# Patient Record
Sex: Female | Born: 1970 | Race: Black or African American | Hispanic: No | Marital: Single | Smoking: Never smoker
Health system: Southern US, Community
[De-identification: ages and names within clinical notes are randomized; demographics above are authoritative.]

## PROBLEM LIST (undated history)

## (undated) ENCOUNTER — Emergency Department (HOSPITAL_BASED_OUTPATIENT_CLINIC_OR_DEPARTMENT_OTHER): Payer: Self-pay | Source: Home / Self Care

## (undated) DIAGNOSIS — G43909 Migraine, unspecified, not intractable, without status migrainosus: Secondary | ICD-10-CM

## (undated) DIAGNOSIS — I10 Essential (primary) hypertension: Secondary | ICD-10-CM

## (undated) DIAGNOSIS — R87619 Unspecified abnormal cytological findings in specimens from cervix uteri: Secondary | ICD-10-CM

## (undated) DIAGNOSIS — E0500 Thyrotoxicosis with diffuse goiter without thyrotoxic crisis or storm: Secondary | ICD-10-CM

## (undated) HISTORY — DX: Thyrotoxicosis with diffuse goiter without thyrotoxic crisis or storm: E05.00

## (undated) HISTORY — DX: Essential (primary) hypertension: I10

## (undated) HISTORY — DX: Migraine, unspecified, not intractable, without status migrainosus: G43.909

## (undated) HISTORY — DX: Unspecified abnormal cytological findings in specimens from cervix uteri: R87.619

---

## 1998-03-20 ENCOUNTER — Emergency Department (HOSPITAL_COMMUNITY): Admission: EM | Admit: 1998-03-20 | Discharge: 1998-03-20 | Payer: Self-pay | Admitting: Emergency Medicine

## 1998-03-20 ENCOUNTER — Encounter: Payer: Self-pay | Admitting: Emergency Medicine

## 2012-09-24 ENCOUNTER — Ambulatory Visit (INDEPENDENT_AMBULATORY_CARE_PROVIDER_SITE_OTHER): Payer: No Typology Code available for payment source | Admitting: Internal Medicine

## 2012-09-24 ENCOUNTER — Encounter: Payer: Self-pay | Admitting: Internal Medicine

## 2012-09-24 VITALS — BP 150/90 | HR 83 | Temp 98.8°F | Resp 18 | Ht 59.0 in | Wt 165.0 lb

## 2012-09-24 DIAGNOSIS — I10 Essential (primary) hypertension: Secondary | ICD-10-CM

## 2012-09-24 DIAGNOSIS — Z139 Encounter for screening, unspecified: Secondary | ICD-10-CM

## 2012-09-24 DIAGNOSIS — Z8669 Personal history of other diseases of the nervous system and sense organs: Secondary | ICD-10-CM

## 2012-09-24 DIAGNOSIS — E663 Overweight: Secondary | ICD-10-CM | POA: Insufficient documentation

## 2012-09-24 LAB — CBC WITH DIFFERENTIAL/PLATELET
Basophils Absolute: 0 10*3/uL (ref 0.0–0.1)
Basophils Relative: 0 % (ref 0–1)
Eosinophils Absolute: 0.1 10*3/uL (ref 0.0–0.7)
Eosinophils Relative: 1 % (ref 0–5)
HCT: 39.2 % (ref 36.0–46.0)
Hemoglobin: 13.1 g/dL (ref 12.0–15.0)
Lymphocytes Relative: 33 % (ref 12–46)
Lymphs Abs: 2.6 10*3/uL (ref 0.7–4.0)
MCH: 29.9 pg (ref 26.0–34.0)
MCHC: 33.4 g/dL (ref 30.0–36.0)
MCV: 89.5 fL (ref 78.0–100.0)
Monocytes Absolute: 0.4 10*3/uL (ref 0.1–1.0)
Monocytes Relative: 5 % (ref 3–12)
Neutro Abs: 4.8 10*3/uL (ref 1.7–7.7)
Neutrophils Relative %: 61 % (ref 43–77)
Platelets: 421 10*3/uL — ABNORMAL HIGH (ref 150–400)
RBC: 4.38 MIL/uL (ref 3.87–5.11)
RDW: 14.4 % (ref 11.5–15.5)
WBC: 8 10*3/uL (ref 4.0–10.5)

## 2012-09-24 LAB — COMPREHENSIVE METABOLIC PANEL
ALT: 12 U/L (ref 0–35)
AST: 18 U/L (ref 0–37)
Albumin: 4.5 g/dL (ref 3.5–5.2)
Alkaline Phosphatase: 61 U/L (ref 39–117)
BUN: 11 mg/dL (ref 6–23)
CO2: 26 mEq/L (ref 19–32)
Calcium: 9.6 mg/dL (ref 8.4–10.5)
Chloride: 103 mEq/L (ref 96–112)
Creat: 0.91 mg/dL (ref 0.50–1.10)
Glucose, Bld: 76 mg/dL (ref 70–99)
Potassium: 4.3 mEq/L (ref 3.5–5.3)
Sodium: 137 mEq/L (ref 135–145)
Total Bilirubin: 0.5 mg/dL (ref 0.3–1.2)
Total Protein: 7.1 g/dL (ref 6.0–8.3)

## 2012-09-24 LAB — LIPID PANEL
Cholesterol: 161 mg/dL (ref 0–200)
HDL: 52 mg/dL (ref >39)
LDL Cholesterol: 99 mg/dL (ref 0–99)
Total CHOL/HDL Ratio: 3.1 Ratio
Triglycerides: 48 mg/dL (ref <150)
VLDL: 10 mg/dL (ref 0–40)

## 2012-09-24 LAB — TSH: TSH: 1.927 u[IU]/mL (ref 0.350–4.500)

## 2012-09-24 MED ORDER — METOPROLOL SUCCINATE ER 25 MG PO TB24: 25.0000 mg | ORAL_TABLET | Freq: Every day | ORAL | Status: DC

## 2012-09-24 NOTE — Patient Instructions (Addendum)
Activate my chart 

## 2012-09-24 NOTE — Progress Notes (Signed)
  Subjective:    Patient ID: Terri [name], female    DOB: 09/11/1970, 42 y.o.   MRN: [id number]  HPI New pt here for first visit.  PMHOf HTN and migraine headaches.  Shehas been without her Metoprolol for about one year.  Pt reports she did not have insurance for a while  She does have frequent headaches on top of head.  No speech or visual changes.    No Known Allergies Past Medical History  Diagnosis Date  . Hypertension   . Migraine    Past Surgical History  Procedure Laterality Date  . Cesarean section     History   Social History  . Marital Status: Single    Spouse Name: N/A    Number of Children: N/A  . Years of Education: N/A   Occupational History  . Not on file.   Social History Main Topics  . Smoking status: Never Smoker   . Smokeless tobacco: Not on file  . Alcohol Use: Not on file  . Drug Use: No  . Sexually Active: Yes    Birth Control/ Protection: Condom   Other Topics Concern  . Not on file   Social History Narrative  . No narrative on file   Family History  Problem Relation Age of Onset  . Hypertension Mother   . Cancer Maternal Aunt 50    colon Cancer  . Cancer Paternal Aunt 78    brest cancer  . Heart disease Maternal Grandmother   . Stroke Maternal Grandmother   . Stroke Paternal Grandmother    Patient Active Problem List  Diagnosis  . Essential hypertension, benign  . History of migraine headaches  . Overweight   No current outpatient prescriptions on file prior to visit.   No current facility-administered medications on file prior to visit.      Review of Systems See HPI    Objective:   Physical Exam Physical Exam  Nursing note and vitals reviewed.  Constitutional: She is oriented to person, place, and time. She appears well-developed and well-nourished.  HENT:  Head: Normocephalic and atraumatic.  Cardiovascular: Normal rate and regular rhythm. Exam reveals no gallop and no friction rub.  No murmur heard.   Pulmonary/Chest: Breath sounds normal. She has no wheezes. She has no rales.  Neurological: She is alert and oriented to person, place, and time.  Skin: Skin is warm and dry.  Psychiatric: She has a normal mood and affect. Her behavior is normal.  Ext no edema             Assessment & Plan:  HTN  EKG today no acute changes.  Will initiate her former med  Metoprolol 25 mg which she tolerated well  MIgraine headache  Will assess if BP control improved headaches  Overweight  See me in 3 weeks get labs today

## 2012-09-25 ENCOUNTER — Encounter: Payer: Self-pay | Admitting: *Deleted

## 2012-09-25 ENCOUNTER — Telehealth: Payer: Self-pay | Admitting: *Deleted

## 2012-09-25 LAB — VITAMIN D 25 HYDROXY (VIT D DEFICIENCY, FRACTURES): Vit D, 25-Hydroxy: 19 ng/mL — ABNORMAL LOW (ref 30–89)

## 2012-09-25 NOTE — Telephone Encounter (Signed)
Message copied by Mathews Robinsons on Tue Sep 25, 2012  8:52 AM ------      Message from: Raechel Chute D      Created: Tue Sep 25, 2012  8:45 AM       Call pt and advise her to take 2000 units of Vitamin D daily              Ok to mail labs to pt ------

## 2012-09-25 NOTE — Telephone Encounter (Signed)
Let message regarding Vit D also included this advise in lab results that was mailed to home address

## 2012-10-15 ENCOUNTER — Ambulatory Visit (INDEPENDENT_AMBULATORY_CARE_PROVIDER_SITE_OTHER): Payer: No Typology Code available for payment source | Admitting: Internal Medicine

## 2012-10-15 ENCOUNTER — Encounter: Payer: Self-pay | Admitting: Internal Medicine

## 2012-10-15 VITALS — BP 135/82 | HR 80 | Temp 97.1°F | Resp 18 | Wt 165.0 lb

## 2012-10-15 DIAGNOSIS — Z8669 Personal history of other diseases of the nervous system and sense organs: Secondary | ICD-10-CM

## 2012-10-15 DIAGNOSIS — E559 Vitamin D deficiency, unspecified: Secondary | ICD-10-CM

## 2012-10-15 DIAGNOSIS — I10 Essential (primary) hypertension: Secondary | ICD-10-CM

## 2012-10-15 MED ORDER — METOPROLOL SUCCINATE ER 50 MG PO TB24: 50.0000 mg | ORAL_TABLET | Freq: Every day | ORAL | Status: DC

## 2012-10-15 NOTE — Progress Notes (Signed)
  Subjective:    Patient ID: Terri [name], female    DOB: 03-Jan-1971, 42 y.o.   MRN: [id number]  HPI Terri [name] is here for follow up..   She has been taking her Metroprolol and states when she checked her bottle at home she was taking 25 mg bid.    Headaches markedly decreased.     See vitamin D  She is now taking OTC vitamin D 2000 units daily  It has been 4 years since she has had a pap smear  No Known Allergies Past Medical History  Diagnosis Date  . Hypertension   . Migraine    Past Surgical History  Procedure Laterality Date  . Cesarean section     History   Social History  . Marital Status: Single    Spouse Name: N/A    Number of Children: N/A  . Years of Education: N/A   Occupational History  . Not on file.   Social History Main Topics  . Smoking status: Never Smoker   . Smokeless tobacco: Not on file  . Alcohol Use: Not on file  . Drug Use: No  . Sexually Active: Yes    Birth Control/ Protection: Condom   Other Topics Concern  . Not on file   Social History Narrative  . No narrative on file   Family History  Problem Relation Age of Onset  . Hypertension Mother   . Cancer Maternal Aunt 50    colon Cancer  . Cancer Paternal Aunt 43    brest cancer  . Heart disease Maternal Grandmother   . Stroke Maternal Grandmother   . Stroke Paternal Grandmother    Patient Active Problem List  Diagnosis  . Essential hypertension, benign  . History of migraine headaches  . Overweight   Current Outpatient Prescriptions on File Prior to Visit  Medication Sig Dispense Refill  . aspirin-acetaminophen-caffeine (EXCEDRIN MIGRAINE) 250-250-65 MG per tablet Take 1 tablet by mouth every 6 (six) hours as needed for pain.      . Aspirin-Acetaminophen-Caffeine (GOODY HEADACHE PO) Take by mouth.      . metoprolol succinate (TOPROL-XL) 25 MG 24 hr tablet Take 1 tablet (25 mg total) by mouth daily.  90 tablet  3   No current facility-administered medications on file prior  to visit.      Review of Systems    see HPI Objective:   Physical Exam Physical Exam  Nursing note and vitals reviewed.  Constitutional: She is oriented to person, place, and time. She appears well-developed and well-nourished.  HENT:  Head: Normocephalic and atraumatic.  Cardiovascular: Normal rate and regular rhythm. Exam reveals no gallop and no friction rub.  No murmur heard.  Pulmonary/Chest: Breath sounds normal. She has no wheezes. She has no rales.  Neurological: She is alert and oriented to person, place, and time.  Skin: Skin is warm and dry.  Psychiatric: She has a normal mood and affect. Her behavior is normal.             Assessment & Plan:  HTN  Better controlled.  I will place her on her former  dose of Toprol 50 mg ER one daily.  Headaches   Improved with BP control  Vitamin D deficiency  OTC 2000 units  Schedule CPE

## 2012-12-26 ENCOUNTER — Telehealth: Payer: Self-pay | Admitting: *Deleted

## 2012-12-26 NOTE — Telephone Encounter (Signed)
Left VM message regarding appt change

## 2013-01-31 ENCOUNTER — Encounter: Payer: No Typology Code available for payment source | Admitting: Internal Medicine

## 2013-02-04 ENCOUNTER — Encounter (HOSPITAL_BASED_OUTPATIENT_CLINIC_OR_DEPARTMENT_OTHER): Payer: Self-pay | Admitting: *Deleted

## 2013-02-04 ENCOUNTER — Emergency Department (HOSPITAL_BASED_OUTPATIENT_CLINIC_OR_DEPARTMENT_OTHER)
Admission: EM | Admit: 2013-02-04 | Discharge: 2013-02-04 | Disposition: A | Discharge status: Home / Self Care | Payer: No Typology Code available for payment source | Attending: Emergency Medicine | Admitting: Emergency Medicine

## 2013-02-04 DIAGNOSIS — G43909 Migraine, unspecified, not intractable, without status migrainosus: Secondary | ICD-10-CM | POA: Insufficient documentation

## 2013-02-04 DIAGNOSIS — J069 Acute upper respiratory infection, unspecified: Secondary | ICD-10-CM

## 2013-02-04 DIAGNOSIS — R6883 Chills (without fever): Secondary | ICD-10-CM | POA: Insufficient documentation

## 2013-02-04 DIAGNOSIS — J3489 Other specified disorders of nose and nasal sinuses: Secondary | ICD-10-CM | POA: Insufficient documentation

## 2013-02-04 DIAGNOSIS — H579 Unspecified disorder of eye and adnexa: Secondary | ICD-10-CM | POA: Insufficient documentation

## 2013-02-04 DIAGNOSIS — Z79899 Other long term (current) drug therapy: Secondary | ICD-10-CM | POA: Insufficient documentation

## 2013-02-04 DIAGNOSIS — I10 Essential (primary) hypertension: Secondary | ICD-10-CM | POA: Insufficient documentation

## 2013-02-04 DIAGNOSIS — R110 Nausea: Secondary | ICD-10-CM | POA: Insufficient documentation

## 2013-02-04 DIAGNOSIS — B309 Viral conjunctivitis, unspecified: Secondary | ICD-10-CM

## 2013-02-04 DIAGNOSIS — R05 Cough: Secondary | ICD-10-CM | POA: Insufficient documentation

## 2013-02-04 DIAGNOSIS — R059 Cough, unspecified: Secondary | ICD-10-CM | POA: Insufficient documentation

## 2013-02-04 DIAGNOSIS — H5789 Other specified disorders of eye and adnexa: Secondary | ICD-10-CM | POA: Insufficient documentation

## 2013-02-04 DIAGNOSIS — H9209 Otalgia, unspecified ear: Secondary | ICD-10-CM | POA: Insufficient documentation

## 2013-02-04 LAB — RAPID STREP SCREEN (MED CTR MEBANE ONLY): Streptococcus, Group A Screen (Direct): NEGATIVE

## 2013-02-04 MED ORDER — SODIUM CHLORIDE 0.65 % NA SOLN: 1.0000 | NASAL | Status: DC | PRN

## 2013-02-04 MED ORDER — HYDROCODONE-HOMATROPINE 5-1.5 MG/5ML PO SYRP: 5.0000 mL | ORAL_SOLUTION | Freq: Four times a day (QID) | ORAL | Status: DC | PRN

## 2013-02-04 NOTE — ED Provider Notes (Signed)
Medical screening examination/treatment/procedure(s) were performed by non-physician practitioner and as supervising physician I was immediately available for consultation/collaboration.   Carleene Cooper III, MD 02/04/13 [phone]

## 2013-02-04 NOTE — ED Provider Notes (Signed)
History    CSN: [id number] Arrival date & time 02/04/13  1144  First MD Initiated Contact with Patient 02/04/13 1213     Chief Complaint  Patient presents with  . URI   (Consider location/radiation/quality/duration/timing/severity/associated sxs/prior Treatment) HPI  Patient is a 42 year old female past medical history significant for migraines and hypertension presenting to the emergency department for a sore throat, nasal and ear congestion, nonproductive cough, chills, itchy and injected right eye with watery discharge that has been going on since Thursday. Patient has tried Sweet oil in her ears with no relief. No other alleviating factors. No aggravating factors. Denies fever, vomiting, abdominal pain, shortness of breath, chest pain, abdominal pain, diarrhea. Patient states she works in an [employer] with possible sick contacts. Patient does not wear contact lenses.   Past Medical History  Diagnosis Date  . Hypertension   . Migraine    Past Surgical History  Procedure Laterality Date  . Cesarean section     Family History  Problem Relation Age of Onset  . Hypertension Mother   . Cancer Maternal Aunt 50    colon Cancer  . Cancer Paternal Aunt 41    brest cancer  . Heart disease Maternal Grandmother   . Stroke Maternal Grandmother   . Stroke Paternal Grandmother    History  Substance Use Topics  . Smoking status: Never Smoker   . Smokeless tobacco: Not on file  . Alcohol Use: No   OB History   Grav Para Term Preterm Abortions TAB SAB Ect Mult Living   3    2     1      Review of Systems  Constitutional: Positive for chills. Negative for fever.  HENT: Positive for ear pain, congestion, sore throat and rhinorrhea. Negative for facial swelling, neck pain, neck stiffness, voice change and ear discharge.   Eyes: Positive for discharge, redness and itching. Negative for photophobia, pain and visual disturbance.  Respiratory: Positive for cough. Negative for  chest tightness, shortness of breath and wheezing.   Cardiovascular: Negative for chest pain.  Gastrointestinal: Positive for nausea. Negative for vomiting and abdominal pain.  Genitourinary: Negative.   Musculoskeletal: Negative for back pain.  Skin: Negative.   Neurological: Negative for headaches.    Allergies  Review of patient's allergies indicates no known allergies.  Home Medications   Current Outpatient Rx  Name  Route  Sig  Dispense  Refill  . aspirin-acetaminophen-caffeine (EXCEDRIN MIGRAINE) 250-250-65 MG per tablet   Oral   Take 1 tablet by mouth every 6 (six) hours as needed for pain.         . Aspirin-Acetaminophen-Caffeine (GOODY HEADACHE PO)   Oral   Take by mouth.         Marland Kitchen HYDROcodone-homatropine (HYCODAN) 5-1.5 MG/5ML syrup   Oral   Take 5 mLs by mouth every 6 (six) hours as needed for cough.   120 mL   0   . metoprolol succinate (TOPROL-XL) 50 MG 24 hr tablet   Oral   Take 1 tablet (50 mg total) by mouth daily. Take with or immediately following a meal.   90 tablet   3   . sodium chloride (OCEAN) 0.65 % nasal spray   Nasal   Place 1 spray into the nose as needed for congestion.   30 mL   0   . Vitamin D, Ergocalciferol, (DRISDOL) 50000 UNITS CAPS   Oral   Take 50,000 Units by mouth.  BP 170/100  Pulse 95  Temp(Src) 98.7 F (37.1 C) (Oral)  Resp 18  Ht 4\' 11"  (1.499 m)  Wt 164 lb (74.39 kg)  BMI 33.11 kg/m2  SpO2 97%  LMP 02/02/2013 Physical Exam  Constitutional: She is oriented to person, place, and time. She appears well-developed and well-nourished. No distress.  HENT:  Head: Normocephalic and atraumatic. No trismus in the jaw.  Right Ear: External ear normal. No drainage.  Left Ear: Tympanic membrane and external ear normal. No drainage. Tympanic membrane is not injected, not erythematous and not bulging.  No middle ear effusion.  Nose: Rhinorrhea present.  Mouth/Throat: Uvula is midline and mucous membranes are  normal. No edematous. Posterior oropharyngeal erythema present. No oropharyngeal exudate, posterior oropharyngeal edema or tonsillar abscesses.  Sweet oil in right ear unable to visualize TM.   Eyes: EOM are normal. Pupils are equal, round, and reactive to light. Right eye exhibits no exudate and no hordeolum. No foreign body present in the right eye. Left eye exhibits no chemosis, no discharge, no exudate and no hordeolum. No foreign body present in the left eye. Right conjunctiva is injected. Right conjunctiva has no hemorrhage. Left conjunctiva is not injected. Left conjunctiva has no hemorrhage. No scleral icterus.  Watery discharge  Neck: Normal range of motion. Neck supple.  Cardiovascular: Normal rate, regular rhythm, normal heart sounds and intact distal pulses.   Pulmonary/Chest: Effort normal and breath sounds normal. No respiratory distress. She has no wheezes. She exhibits no tenderness.  Abdominal: Soft. There is no tenderness.  Lymphadenopathy:    She has no cervical adenopathy.  Neurological: She is alert and oriented to person, place, and time.  Skin: Skin is warm and dry. She is not diaphoretic.  Psychiatric: She has a normal mood and affect.    ED Course  Procedures (including critical care time) Labs Reviewed  RAPID STREP SCREEN  CULTURE, GROUP A STREP   No results found. 1. URI (upper respiratory infection)   2. Viral conjunctivitis     MDM  Patients symptoms are consistent with URI, likely viral etiology. Patient presentation consistent with viral conjunctivitis.  No purulent discharge, entrapment, consensual photophobia.  Presentation non-concerning for iritis, bacterial conjunctivitis, corneal abrasions, or HSV.  No antibiotics are indicated and patient will be prescribed naphazoline for itching.  Personal hygiene and frequent handwashing discussed.  Patient advised to followup with ophthalmologist if symptoms persist or worsen in any way including vision change or  purulent discharge.  Patient verbalizes understanding and is agreeable with discharge. Discussed that antibiotics are not indicated for viral infections. Pt will be discharged with symptomatic treatment.  Verbalizes understanding and is agreeable with plan. Pt is hemodynamically stable & in NAD prior to dc. Patient is stable at time of discharge    Jeannetta Ellis, PA-C 02/04/13 1346

## 2013-02-04 NOTE — ED Notes (Signed)
Ear ache and feels like ears stopped up sore throat and right eye irritated itchy and watery

## 2013-02-06 ENCOUNTER — Ambulatory Visit (INDEPENDENT_AMBULATORY_CARE_PROVIDER_SITE_OTHER): Payer: No Typology Code available for payment source | Admitting: Internal Medicine

## 2013-02-06 ENCOUNTER — Encounter: Payer: Self-pay | Admitting: Internal Medicine

## 2013-02-06 ENCOUNTER — Ambulatory Visit (HOSPITAL_BASED_OUTPATIENT_CLINIC_OR_DEPARTMENT_OTHER)
Admission: RE | Admit: 2013-02-06 | Discharge: 2013-02-06 | Disposition: A | Discharge status: Home / Self Care | Payer: No Typology Code available for payment source | Source: Ambulatory Visit | Attending: Internal Medicine | Admitting: Internal Medicine

## 2013-02-06 ENCOUNTER — Encounter: Payer: Self-pay | Admitting: *Deleted

## 2013-02-06 VITALS — BP 178/102 | HR 100 | Temp 98.3°F | Resp 20 | Wt 161.0 lb

## 2013-02-06 DIAGNOSIS — Z124 Encounter for screening for malignant neoplasm of cervix: Secondary | ICD-10-CM

## 2013-02-06 DIAGNOSIS — Z1231 Encounter for screening mammogram for malignant neoplasm of breast: Secondary | ICD-10-CM | POA: Insufficient documentation

## 2013-02-06 DIAGNOSIS — H6690 Otitis media, unspecified, unspecified ear: Secondary | ICD-10-CM

## 2013-02-06 DIAGNOSIS — I10 Essential (primary) hypertension: Secondary | ICD-10-CM

## 2013-02-06 DIAGNOSIS — Z1151 Encounter for screening for human papillomavirus (HPV): Secondary | ICD-10-CM

## 2013-02-06 DIAGNOSIS — E559 Vitamin D deficiency, unspecified: Secondary | ICD-10-CM

## 2013-02-06 DIAGNOSIS — Z0000 Encounter for general adult medical examination without abnormal findings: Secondary | ICD-10-CM

## 2013-02-06 DIAGNOSIS — H6692 Otitis media, unspecified, left ear: Secondary | ICD-10-CM

## 2013-02-06 DIAGNOSIS — H109 Unspecified conjunctivitis: Secondary | ICD-10-CM

## 2013-02-06 DIAGNOSIS — H6691 Otitis media, unspecified, right ear: Secondary | ICD-10-CM

## 2013-02-06 LAB — POCT URINALYSIS DIPSTICK
Glucose, UA: NEGATIVE
Ketones, UA: NEGATIVE
Protein, UA: NEGATIVE
Spec Grav, UA: 1.025
Urobilinogen, UA: NEGATIVE

## 2013-02-06 LAB — CULTURE, GROUP A STREP

## 2013-02-06 MED ORDER — CLONIDINE HCL 0.1 MG PO TABS
0.1000 mg | ORAL_TABLET | Freq: Once | ORAL | Status: AC
  Administered 2013-02-06: 0.1 mg via ORAL

## 2013-02-06 MED ORDER — CIPROFLOXACIN HCL 0.3 % OP SOLN: OPHTHALMIC | Status: DC

## 2013-02-06 MED ORDER — CEFTRIAXONE SODIUM 1 G IJ SOLR
1.0000 g | INTRAMUSCULAR | Status: AC
  Administered 2013-02-06: 1 g via INTRAMUSCULAR

## 2013-02-06 MED ORDER — AZITHROMYCIN 250 MG PO TABS: ORAL_TABLET | ORAL | Status: DC

## 2013-02-06 NOTE — Progress Notes (Signed)
Subjective:    Patient ID: Terri [name], female    DOB: Mar 10, 1971, 42 y.o.   MRN: [id number]  HPI Terri [name] is here for CPE and acute visit.    She Had cough, sore throat,  Eye redness with matting  Bilaterally.  Cough has been productive of bloody sputum a few days ago.  Symptoms started 3 days ago.  She also had difficulty hearing with R sided ear pain.   No fever no SOB no chest pain  Exposure to pt with "pink eye".  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  LMP  4 days ago.   See BP  She has not taken her BP med in 2 days as she was nauseated and yesterday vomitied twice.  No emesis today   No Known Allergies Past Medical History  Diagnosis Date  . Hypertension   . Migraine    Past Surgical History  Procedure Laterality Date  . Cesarean section     History   Social History  . Marital Status: Single    Spouse Name: N/A    Number of Children: N/A  . Years of Education: N/A   Occupational History  . Not on file.   Social History Main Topics  . Smoking status: Never Smoker   . Smokeless tobacco: Not on file  . Alcohol Use: No  . Drug Use: No  . Sexually Active: Yes    Birth Control/ Protection: Condom   Other Topics Concern  . Not on file   Social History Narrative  . No narrative on file   Family History  Problem Relation Age of Onset  . Hypertension Mother   . Cancer Maternal Aunt 50    colon Cancer  . Cancer Paternal Aunt 87    brest cancer  . Heart disease Maternal Grandmother   . Stroke Maternal Grandmother   . Stroke Paternal Grandmother    Patient Active Problem List   Diagnosis Date Noted  . Unspecified vitamin D deficiency 10/15/2012  . Essential hypertension, benign 09/24/2012  . History of migraine headaches 09/24/2012  . Overweight 09/24/2012   Current Outpatient Prescriptions on File Prior to Visit  Medication Sig Dispense Refill  . aspirin-acetaminophen-caffeine (EXCEDRIN MIGRAINE)  250-250-65 MG per tablet Take 1 tablet by mouth every 6 (six) hours as needed for pain.      . Aspirin-Acetaminophen-Caffeine (GOODY HEADACHE PO) Take by mouth.      Marland Kitchen HYDROcodone-homatropine (HYCODAN) 5-1.5 MG/5ML syrup Take 5 mLs by mouth every 6 (six) hours as needed for cough.  120 mL  0  . metoprolol succinate (TOPROL-XL) 50 MG 24 hr tablet Take 1 tablet (50 mg total) by mouth daily. Take with or immediately following a meal.  90 tablet  3  . sodium chloride (OCEAN) 0.65 % nasal spray Place 1 spray into the nose as needed for congestion.  30 mL  0  . Vitamin D, Ergocalciferol, (DRISDOL) 50000 UNITS CAPS Take 50,000 Units by mouth.       No current facility-administered medications on file prior to visit.          Review of Systems  HENT: Positive for hearing loss and ear pain. Negative for ear discharge.   Eyes: Positive for discharge, redness and itching. Negative for photophobia, pain and visual disturbance.  Respiratory: Positive for cough.        Objective:   Physical Exam Physical Exam  Vital signs and nursing note reviewed  Constitutional: She is oriented to person, place, and time. She appears well-developed and well-nourished. She is cooperative.  HENT:  Head: Normocephalic and atraumatic.  Right Ear: Tympanic membrane normal.  Left Ear: Tympanic membrane normal.  Nose: Nose normal.  Mouth/Throat: Oropharynx is clear and moist and mucous membranes are normal. No oropharyngeal exudate or posterior oropharyngeal erythema.  Eyes: Conjunctivae and EOM are normal. Pupils are equal, round, and reactive to light.  Neck: Neck supple. No JVD present. Carotid bruit is not present. No mass and no thyromegaly present.  Cardiovascular: Regular rhythm, normal heart sounds, intact distal pulses and normal pulses.  Exam reveals no gallop and no friction rub.   No murmur heard. Pulses:      Dorsalis pedis pulses are 2+ on the right side, and 2+ on the left side.  Pulmonary/Chest:  Breath sounds normal. She has no wheezes. She has no rhonchi. She has no rales. Right breast exhibits no mass, no nipple discharge and no skin change. Left breast exhibits no mass, no nipple discharge and no skin change.  Abdominal: Soft. Bowel sounds are normal. She exhibits no distension and no mass. There is no hepatosplenomegaly. There is no tenderness. There is no CVA tenderness.  Genitourinary: Rectum normal, vagina normal and uterus normal.  No labial fusion. There is no lesion on the right labia. There is no lesion on the left labia. Cervix exhibits no motion tenderness. Right adnexum displays no mass, no tenderness and no fullness. Left adnexum displays no mass, no tenderness and no fullness. No erythema around the vagina.  Musculoskeletal:       No active synovitis to any joint.    Lymphadenopathy:       Right cervical: No superficial cervical adenopathy present.      Left cervical: No superficial cervical adenopathy present.       Right axillary: No pectoral and no lateral adenopathy present.       Left axillary: No pectoral and no lateral adenopathy present.      Right: No inguinal adenopathy present.       Left: No inguinal adenopathy present.  Neurological: She is alert and oriented to person, place, and time. She has normal strength and normal reflexes. No cranial nerve deficit or sensory deficit. She displays a negative Romberg sign. Coordination and gait normal.  Skin: Skin is warm and dry. No abrasion, no bruising, no ecchymosis and no rash noted. No cyanosis. Nails show no clubbing.  Psychiatric: She has a normal mood and affect. Her speech is normal and behavior is normal.          Assessment & Plan:   Health Maintenance:  Will get pap today  Uncontrolled HTN today  Will give clonidine 0.1 mg.  In office.  Pt advised to take her toprol tonight and dialy   Vitamin D deficiency will check today  Conjunctivitis  Ciloxan  1 gtt OU  R OM  Rocephin 1 gm in office today   Will give Z-palk  See me next week              Assessment & Plan:

## 2013-02-06 NOTE — Patient Instructions (Addendum)
See me next week 

## 2013-02-07 ENCOUNTER — Encounter: Payer: Self-pay | Admitting: *Deleted

## 2013-02-07 LAB — VITAMIN D 25 HYDROXY (VIT D DEFICIENCY, FRACTURES): Vit D, 25-Hydroxy: 58 ng/mL (ref 30–89)

## 2013-02-14 ENCOUNTER — Encounter: Payer: Self-pay | Admitting: Internal Medicine

## 2013-02-14 ENCOUNTER — Ambulatory Visit (INDEPENDENT_AMBULATORY_CARE_PROVIDER_SITE_OTHER): Payer: No Typology Code available for payment source | Admitting: Internal Medicine

## 2013-02-14 VITALS — BP 138/88 | HR 83 | Temp 98.6°F | Resp 18 | Wt 165.0 lb

## 2013-02-14 DIAGNOSIS — I10 Essential (primary) hypertension: Secondary | ICD-10-CM

## 2013-02-14 DIAGNOSIS — H109 Unspecified conjunctivitis: Secondary | ICD-10-CM

## 2013-02-14 NOTE — Patient Instructions (Signed)
See me as needed 

## 2013-02-14 NOTE — Progress Notes (Signed)
Subjective:    Patient ID: Terri [name], female    DOB: January 01, 1971, 42 y.o.   MRN: [id number]  HPI Terri [name] is here for follow up  See BP  Improved with daily med use  Conjunctivitis  Improved.   No Known Allergies Past Medical History  Diagnosis Date  . Hypertension   . Migraine    Past Surgical History  Procedure Laterality Date  . Cesarean section     History   Social History  . Marital Status: Single    Spouse Name: N/A    Number of Children: N/A  . Years of Education: N/A   Occupational History  . Not on file.   Social History Main Topics  . Smoking status: Never Smoker   . Smokeless tobacco: Not on file  . Alcohol Use: No  . Drug Use: No  . Sexually Active: Yes    Birth Control/ Protection: Condom   Other Topics Concern  . Not on file   Social History Narrative  . No narrative on file   Family History  Problem Relation Age of Onset  . Hypertension Mother   . Cancer Maternal Aunt 50    colon Cancer  . Cancer Paternal Aunt 79    brest cancer  . Heart disease Maternal Grandmother   . Stroke Maternal Grandmother   . Stroke Paternal Grandmother    Patient Active Problem List   Diagnosis Date Noted  . Unspecified vitamin D deficiency 10/15/2012  . Essential hypertension, benign 09/24/2012  . History of migraine headaches 09/24/2012  . Overweight 09/24/2012   Current Outpatient Prescriptions on File Prior to Visit  Medication Sig Dispense Refill  . aspirin-acetaminophen-caffeine (EXCEDRIN MIGRAINE) 250-250-65 MG per tablet Take 1 tablet by mouth every 6 (six) hours as needed for pain.      . Aspirin-Acetaminophen-Caffeine (GOODY HEADACHE PO) Take by mouth.      . ciprofloxacin (CILOXAN) 0.3 % ophthalmic solution Administer 1 gtt OU qid  5 mL  0  . HYDROcodone-homatropine (HYCODAN) 5-1.5 MG/5ML syrup Take 5 mLs by mouth every 6 (six) hours as needed for cough.  120 mL  0  . Vitamin D, Ergocalciferol, (DRISDOL) 50000 UNITS CAPS Take 50,000 Units by  mouth.      . metoprolol succinate (TOPROL-XL) 50 MG 24 hr tablet Take 1 tablet (50 mg total) by mouth daily. Take with or immediately following a meal.  90 tablet  3  . sodium chloride (OCEAN) 0.65 % nasal spray Place 1 spray into the nose as needed for congestion.  30 mL  0   No current facility-administered medications on file prior to visit.       Review of Systems    See HPI Objective:   Physical Exam Physical Exam  Nursing note and vitals reviewed.  Constitutional: She is oriented to person, place, and time. She appears well-developed and well-nourished.  HENT:  Head: Normocephalic and atraumatic.  Conjuctiva  Clear bilaterally Cardiovascular: Normal rate and regular rhythm. Exam reveals no gallop and no friction rub.  No murmur heard.  Pulmonary/Chest: Breath sounds normal. She has no wheezes. She has no rales.  Neurological: She is alert and oriented to person, place, and time.  Skin: Skin is warm and dry.  Psychiatric: She has a normal mood and affect. Her behavior is normal.              Assessment & Plan:  HTN  Improved  Continue daily toprol  Conjunctivitis  Improved  See me  as needed

## 2013-06-06 ENCOUNTER — Other Ambulatory Visit: Payer: Self-pay

## 2013-10-10 ENCOUNTER — Ambulatory Visit: Payer: No Typology Code available for payment source | Admitting: Internal Medicine

## 2013-11-06 ENCOUNTER — Other Ambulatory Visit: Payer: Self-pay | Admitting: Internal Medicine

## 2013-11-06 NOTE — Telephone Encounter (Signed)
Refill request

## 2013-11-08 ENCOUNTER — Other Ambulatory Visit: Payer: Self-pay | Admitting: *Deleted

## 2013-11-08 MED ORDER — METOPROLOL SUCCINATE ER 50 MG PO TB24: 50.0000 mg | ORAL_TABLET | Freq: Every day | ORAL | Status: DC

## 2013-11-08 NOTE — Telephone Encounter (Signed)
Refill request. Pt will come in for labs within the next week

## 2013-11-11 ENCOUNTER — Other Ambulatory Visit: Payer: Self-pay | Admitting: *Deleted

## 2013-12-04 ENCOUNTER — Telehealth: Payer: Self-pay | Admitting: *Deleted

## 2013-12-04 NOTE — Telephone Encounter (Signed)
See Heather's note 

## 2013-12-04 NOTE — Telephone Encounter (Signed)
Tell Teshia that it [facility] be less [facility] to [facility]  Dr. Marice Potterove or Dr. Wyvonnia LoraBrooke Silva

## 2013-12-04 NOTE — Telephone Encounter (Signed)
Terri [name] [phone] would like a recommendation on who to see to have her tubes tied.

## 2013-12-05 NOTE — Telephone Encounter (Signed)
LVM message regarding recommendation

## 2014-01-06 ENCOUNTER — Other Ambulatory Visit: Payer: Self-pay

## 2014-01-07 LAB — CYTOLOGY - PAP

## 2014-02-05 ENCOUNTER — Encounter: Payer: Self-pay | Admitting: Obstetrics & Gynecology

## 2014-02-05 ENCOUNTER — Ambulatory Visit (INDEPENDENT_AMBULATORY_CARE_PROVIDER_SITE_OTHER): Payer: 59 | Admitting: Obstetrics & Gynecology

## 2014-02-05 VITALS — BP 166/92 | HR 67 | Resp 16 | Ht 59.0 in | Wt 176.0 lb

## 2014-02-05 DIAGNOSIS — Z3009 Encounter for other general counseling and advice on contraception: Secondary | ICD-10-CM

## 2014-02-05 NOTE — Progress Notes (Signed)
   Subjective:    Patient ID: Terri [name], female    DOB: 03/03/1971, 43 y.o.   MRN: [id number]  HPI 43 yo S AA G4P1 A3 20(43 yo daughter), 2 grandchildren. She is here today to discuss permanent sterility.    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    Objective:   Physical Exam        Assessment & Plan:  We had a thorough discussion of all forms of permanent sterilty. She opts for lap BS due to its decrease in future ovarian cancer risk.

## 2014-02-05 NOTE — Patient Instructions (Signed)
Salpingectomy Salpingectomy, also called tubectomy, is the surgical removal of one of the fallopian tubes. The fallopian tubes are tubes that are connected to the uterus. These tubes transport the egg from the ovary to the uterus. A salpingectomy may be done for various reasons, including:   A tubal (ectopic) pregnancy. This is especially true if the tube ruptures.  An infected fallopian tube.  The need to remove the fallopian tube when removing an ovary with a cyst or tumor.  The need to remove the fallopian tube when removing the uterus.  Cancer of the fallopian tube or nearby organs. Removing one fallopian tube does not prevent you from becoming pregnant. It also does not cause problems with your menstrual periods.  LET [facility] CARE PROVIDER KNOW ABOUT:  Any allergies you have.  All medicines you are taking, including vitamins, herbs, eye drops, creams, and over-the-counter medicines.  Previous problems you or members of your family have had with the use of anesthetics.  Any blood disorders you have.  Previous surgeries you have had.  Medical conditions you have. RISKS AND COMPLICATIONS  Generally, this is a safe procedure. However, as with any procedure, complications can occur. Possible complications include:  Injury to surrounding organs.  Bleeding.  Infection.  Problems related to anesthesia. BEFORE THE PROCEDURE  Ask your health care provider about changing or stopping your regular medicines. You may need to stop taking certain medicines, such as aspirin or blood thinners, at least 1 week before the surgery.  Do not eat or drink anything for at least 8 hours before the surgery.  If you smoke, do not smoke for at least 2 weeks before the surgery.  Make plans to have someone drive you home after the procedure or after your hospital stay. Also arrange for someone to help you with activities during recovery. PROCEDURE   You will be given medicine to help you  relax before the procedure (sedative). You will then be given medicine to make you sleep through the procedure (general anesthetic). These medicines will be given through an IV access tube that is put into one of your veins.  Once you are asleep, your lower abdomen will be shaved and cleaned. A thin, flexible tube (catheter) will be placed in your bladder.  The surgeon may use a laparoscopic, robotic, or open technique for this surgery:  In the laparoscopic technique, the surgery is done through two small cuts (incisions) in the abdomen. A thin, lighted tube with a tiny camera on the end (laparoscope) is inserted into one of the incisions. The tools needed for the procedure are put through the other incision.  A robotic technique may be chosen to perform complex surgery in a small space. In the robotic technique, small incisions will be made. A camera and surgical instruments are passed through the incisions. Surgical instruments will be controlled with the help of a robotic arm.  In the open technique, the surgery is done through one large incision in the abdomen.  Using any of these techniques, the surgeon removes the fallopian tube from where it attaches to the uterus. The blood vessels will be clamped and tied.  The surgeon then uses staples or stitches to close the incision or incisions. AFTER THE PROCEDURE   You will be taken to a recovery area where your progress will be monitored for 1-3 hours.  If the laparoscopic technique was used, you may be allowed to go home after several hours. You may have some shoulder pain after  the laparoscopic procedure. This is normal and usually goes away in a day or two.  If the open technique was used, you will be admitted to the hospital for a couple of days.  You will be given pain medicine if needed.  The IV access tube and catheter will be removed before you are discharged. Document Released: 12/04/2008 Document Revised: 05/08/2013 Document  Reviewed: 01/09/2013 [facility] Patient Information 2015 [place], [place]. This information is not intended to replace advice given to you by your health care provider. Make sure you discuss any questions you have with your health care provider. Diagnostic Laparoscopy Laparoscopy is a surgical procedure. It is used to diagnose and treat diseases inside the belly (abdomen). It is usually a brief, common, and relatively simple procedure. The laparoscopeis a thin, lighted, pencil-sized instrument. It is like a telescope. It is inserted into your abdomen through a small cut (incision). Your caregiver can look at the organs inside your body through this instrument. He or she can see if there is anything abnormal. Laparoscopy can be done either in a hospital or outpatient clinic. You may be given a mild sedative to help you relax before the procedure. Once in the operating room, you will be given a drug to make you sleep (general anesthesia). Laparoscopy usually lasts less than 1 hour. After the procedure, you will be monitored in a recovery area until you are stable and doing well. Once you are home, it will take 2 to 3 days to fully recover. RISKS AND COMPLICATIONS  Laparoscopy has relatively few risks. Your caregiver will discuss the risks with you before the procedure. Some problems that can occur include:  Infection.  Bleeding.  Damage to other organs.  Anesthetic side effects. PROCEDURE Once you receive anesthesia, your surgeon inflates the abdomen with a harmless gas (carbon dioxide). This makes the organs easier to see. The laparoscope is inserted into the abdomen through a small incision. This allows your surgeon to see into the abdomen. Other small instruments are also inserted into the abdomen through other small openings. Many surgeons attach a video camera to the laparoscope to enlarge the view. During a diagnostic laparoscopy, the surgeon may be looking for inflammation, infection, or cancer.  Your surgeon may take tissue samples(biopsies). The samples are sent to a specialist in looking at cells and tissue samples (pathologist). The pathologist examines them under a microscope. Biopsies can help to diagnose or confirm a disease. AFTER THE PROCEDURE   The gas is released from inside the abdomen.  The incisions are closed with stitches (sutures). Because these incisions are small (usually less than 1/2 inch), there is usually minimal discomfort after the procedure. There may be some mild discomfort in the throat. This is from the tube placed in the throat while you were sleeping. You may have some mild abdominal discomfort. There may also be discomfort from the instrument placement incisions in the abdomen.  The recovery time is shortened as long as there are no complications.  You will rest in a recovery room until stable and doing well. As long as there are no complications, you may be allowed to go home. FINDING OUT THE RESULTS OF YOUR TEST Not all test results are available during your visit. If your test results are not back during the visit, make an appointment with your caregiver to find out the results. Do not assume everything is normal if you have not heard from your caregiver or the medical facility. It is important for you to  follow up on all of your test results. HOME CARE INSTRUCTIONS   Take all medicines as directed.  Only take over-the-counter or prescription medicines for pain, discomfort, or fever as directed by your caregiver.  Resume daily activities as directed.  Showers are preferred over baths.  You may resume sexual activities in 1 week or as directed.  Do not drive while taking narcotics. SEEK MEDICAL CARE IF:   There is increasing abdominal pain.  There is new pain in the shoulders (shoulder strap areas).  You feel lightheaded or faint.  You have the chills.  You or your child has an oral temperature above 102 F (38.9 C).  There is pus-like  (purulent) drainage from any of the wounds.  You are unable to pass gas or have a bowel movement.  You feel sick to your stomach (nauseous) or throw up (vomit). MAKE SURE YOU:   Understand these instructions.  Will watch your condition.  Will get help right away if you are not doing well or get worse. Document Released: 10/24/2000 Document Revised: 11/12/2012 Document Reviewed: 07/18/2007 [facility] Patient Information 2015 [place], [place]. This information is not intended to replace advice given to you by your health care provider. Make sure you discuss any questions you have with your health care provider.

## 2014-03-17 ENCOUNTER — Encounter (HOSPITAL_COMMUNITY)
Admission: RE | Admit: 2014-03-17 | Discharge: 2014-03-17 | Disposition: A | Discharge status: Home / Self Care | Payer: 59 | Source: Ambulatory Visit | Attending: Obstetrics & Gynecology | Admitting: Obstetrics & Gynecology

## 2014-03-17 ENCOUNTER — Encounter (HOSPITAL_COMMUNITY): Payer: Self-pay

## 2014-03-17 DIAGNOSIS — K660 Peritoneal adhesions (postprocedural) (postinfection): Secondary | ICD-10-CM | POA: Diagnosis not present

## 2014-03-17 DIAGNOSIS — Z7982 Long term (current) use of aspirin: Secondary | ICD-10-CM | POA: Diagnosis not present

## 2014-03-17 DIAGNOSIS — Z302 Encounter for sterilization: Secondary | ICD-10-CM | POA: Diagnosis not present

## 2014-03-17 DIAGNOSIS — G43909 Migraine, unspecified, not intractable, without status migrainosus: Secondary | ICD-10-CM | POA: Diagnosis not present

## 2014-03-17 DIAGNOSIS — I10 Essential (primary) hypertension: Secondary | ICD-10-CM | POA: Diagnosis not present

## 2014-03-17 DIAGNOSIS — Z79899 Other long term (current) drug therapy: Secondary | ICD-10-CM | POA: Diagnosis not present

## 2014-03-17 LAB — CBC
HCT: 36.4 % (ref 36.0–46.0)
Hemoglobin: 11.8 g/dL — ABNORMAL LOW (ref 12.0–15.0)
MCH: 25.9 pg — ABNORMAL LOW (ref 26.0–34.0)
MCHC: 32.4 g/dL (ref 30.0–36.0)
MCV: 80 fL (ref 78.0–100.0)
Platelets: 293 K/uL (ref 150–400)
RBC: 4.55 MIL/uL (ref 3.87–5.11)
RDW: 18.8 % — ABNORMAL HIGH (ref 11.5–15.5)
WBC: 6.5 K/uL (ref 4.0–10.5)

## 2014-03-17 LAB — BASIC METABOLIC PANEL
Anion gap: 9 (ref 5–15)
BUN: 11 mg/dL (ref 6–23)
CALCIUM: 8.9 mg/dL (ref 8.4–10.5)
CO2: 24 mEq/L (ref 19–32)
CREATININE: 0.91 mg/dL (ref 0.50–1.10)
Chloride: 104 mEq/L (ref 96–112)
GFR calc Af Amer: 88 mL/min — ABNORMAL LOW (ref >90)
GFR calc non Af Amer: 76 mL/min — ABNORMAL LOW (ref >90)
GLUCOSE: 90 mg/dL (ref 70–99)
Potassium: 3.7 mEq/L (ref 3.7–5.3)
Sodium: 137 mEq/L (ref 137–147)

## 2014-03-17 NOTE — Patient Instructions (Signed)
Your procedure is scheduled on:03/18/14  Enter through the Main Entrance at :10 am Pick up desk phone and dial [phone] and inform us of your arrival.  Please call [phone] if you have any problems the morning of surgery.  Remember: Do not eat food or drink liquids, including water, after midnight:tonight    You may brush your teeth the morning of surgery.    DO NOT wear jewelry, eye make-up, lipstick,body lotion, or dark fingernail polish.  (Polished toes are ok) You may wear deodorant.  If you are to be admitted after surgery, leave suitcase in car until your room has been assigned. Patients discharged on the day of surgery will not be allowed to drive home. Wear loose fitting, comfortable clothes for your ride home.

## 2014-03-18 ENCOUNTER — Encounter (HOSPITAL_COMMUNITY): Payer: Self-pay

## 2014-03-18 ENCOUNTER — Encounter (HOSPITAL_COMMUNITY)
Admission: RE | Disposition: A | Discharge status: Home / Self Care | Payer: Self-pay | Source: Ambulatory Visit | Attending: Obstetrics & Gynecology

## 2014-03-18 ENCOUNTER — Encounter (HOSPITAL_COMMUNITY): Payer: 59 | Admitting: Certified Registered Nurse Anesthetist

## 2014-03-18 ENCOUNTER — Ambulatory Visit (HOSPITAL_COMMUNITY)
Admission: RE | Admit: 2014-03-18 | Discharge: 2014-03-18 | Disposition: A | Discharge status: Home / Self Care | Payer: 59 | Source: Ambulatory Visit | Attending: Obstetrics & Gynecology | Admitting: Obstetrics & Gynecology

## 2014-03-18 ENCOUNTER — Ambulatory Visit (HOSPITAL_COMMUNITY): Payer: 59 | Admitting: Certified Registered Nurse Anesthetist

## 2014-03-18 DIAGNOSIS — Z7982 Long term (current) use of aspirin: Secondary | ICD-10-CM | POA: Insufficient documentation

## 2014-03-18 DIAGNOSIS — G43909 Migraine, unspecified, not intractable, without status migrainosus: Secondary | ICD-10-CM | POA: Insufficient documentation

## 2014-03-18 DIAGNOSIS — Z302 Encounter for sterilization: Secondary | ICD-10-CM | POA: Diagnosis not present

## 2014-03-18 DIAGNOSIS — I10 Essential (primary) hypertension: Secondary | ICD-10-CM | POA: Insufficient documentation

## 2014-03-18 DIAGNOSIS — Z79899 Other long term (current) drug therapy: Secondary | ICD-10-CM | POA: Insufficient documentation

## 2014-03-18 DIAGNOSIS — K660 Peritoneal adhesions (postprocedural) (postinfection): Secondary | ICD-10-CM | POA: Insufficient documentation

## 2014-03-18 HISTORY — PX: LAPAROSCOPIC BILATERAL SALPINGECTOMY: SHX5889

## 2014-03-18 HISTORY — PX: LAPAROSCOPIC LYSIS OF ADHESIONS: SHX5905

## 2014-03-18 LAB — PREGNANCY, URINE: PREG TEST UR: NEGATIVE

## 2014-03-18 SURGERY — SALPINGECTOMY, BILATERAL, LAPAROSCOPIC
Anesthesia: General | Site: Abdomen | Laterality: Bilateral

## 2014-03-18 MED ORDER — NALOXONE HCL 0.4 MG/ML IJ SOLN
INTRAMUSCULAR | Status: AC
  Administered 2014-03-18: 0.08 mg via INTRAVENOUS
  Filled 2014-03-18: qty 1

## 2014-03-18 MED ORDER — PROPOFOL 10 MG/ML IV BOLUS
INTRAVENOUS | Status: DC | PRN
  Administered 2014-03-18: 150 mg via INTRAVENOUS

## 2014-03-18 MED ORDER — LABETALOL HCL 5 MG/ML IV SOLN
INTRAVENOUS | Status: AC
  Filled 2014-03-18: qty 4

## 2014-03-18 MED ORDER — ACETAMINOPHEN 500 MG PO TABS
ORAL_TABLET | ORAL | Status: AC
  Filled 2014-03-18: qty 2

## 2014-03-18 MED ORDER — FENTANYL CITRATE 0.05 MG/ML IJ SOLN
INTRAMUSCULAR | Status: DC | PRN
  Administered 2014-03-18: 100 ug via INTRAVENOUS
  Administered 2014-03-18: 50 ug via INTRAVENOUS
  Administered 2014-03-18: 100 ug via INTRAVENOUS

## 2014-03-18 MED ORDER — NEOSTIGMINE METHYLSULFATE 10 MG/10ML IV SOLN
INTRAVENOUS | Status: DC | PRN
  Administered 2014-03-18: 3 mg via INTRAVENOUS

## 2014-03-18 MED ORDER — LABETALOL HCL 5 MG/ML IV SOLN
INTRAVENOUS | Status: DC | PRN
  Administered 2014-03-18 (×4): 5 mg via INTRAVENOUS

## 2014-03-18 MED ORDER — BUPIVACAINE HCL (PF) 0.5 % IJ SOLN
INTRAMUSCULAR | Status: DC | PRN
  Administered 2014-03-18: 25 mL

## 2014-03-18 MED ORDER — DEXAMETHASONE SODIUM PHOSPHATE 10 MG/ML IJ SOLN
INTRAMUSCULAR | Status: AC
  Filled 2014-03-18: qty 1

## 2014-03-18 MED ORDER — ONDANSETRON HCL 4 MG/2ML IJ SOLN
INTRAMUSCULAR | Status: AC
  Filled 2014-03-18: qty 2

## 2014-03-18 MED ORDER — HYDROMORPHONE HCL PF 1 MG/ML IJ SOLN
INTRAMUSCULAR | Status: AC
  Filled 2014-03-18: qty 1

## 2014-03-18 MED ORDER — MIDAZOLAM HCL 2 MG/2ML IJ SOLN
INTRAMUSCULAR | Status: DC | PRN
  Administered 2014-03-18: 3 mg via INTRAVENOUS

## 2014-03-18 MED ORDER — NEOSTIGMINE METHYLSULFATE 10 MG/10ML IV SOLN
INTRAVENOUS | Status: AC
  Filled 2014-03-18: qty 1

## 2014-03-18 MED ORDER — HYDROMORPHONE HCL PF 1 MG/ML IJ SOLN
INTRAMUSCULAR | Status: DC | PRN
  Administered 2014-03-18: 1 mg via INTRAVENOUS

## 2014-03-18 MED ORDER — DEXAMETHASONE SODIUM PHOSPHATE 10 MG/ML IJ SOLN
INTRAMUSCULAR | Status: DC | PRN
  Administered 2014-03-18: 4 mg via INTRAVENOUS

## 2014-03-18 MED ORDER — ONDANSETRON HCL 4 MG/2ML IJ SOLN
INTRAMUSCULAR | Status: DC | PRN
  Administered 2014-03-18: 4 mg via INTRAVENOUS

## 2014-03-18 MED ORDER — OXYCODONE-ACETAMINOPHEN 5-325 MG PO TABS: 1.0000 | ORAL_TABLET | Freq: Four times a day (QID) | ORAL | Status: DC | PRN

## 2014-03-18 MED ORDER — SCOPOLAMINE 1 MG/3DAYS TD PT72
MEDICATED_PATCH | TRANSDERMAL | Status: DC
Start: 2014-03-18 — End: 2014-03-18
  Administered 2014-03-18: 1.5 mg via TRANSDERMAL
  Filled 2014-03-18: qty 1

## 2014-03-18 MED ORDER — MIDAZOLAM HCL 2 MG/2ML IJ SOLN
INTRAMUSCULAR | Status: AC
  Filled 2014-03-18: qty 2

## 2014-03-18 MED ORDER — FENTANYL CITRATE 0.05 MG/ML IJ SOLN: 25.0000 ug | INTRAMUSCULAR | Status: DC | PRN

## 2014-03-18 MED ORDER — SCOPOLAMINE 1 MG/3DAYS TD PT72
1.0000 | MEDICATED_PATCH | Freq: Once | TRANSDERMAL | Status: AC
  Administered 2014-03-18: 1 via TRANSDERMAL
  Administered 2014-03-18: 1.5 mg via TRANSDERMAL

## 2014-03-18 MED ORDER — ROCURONIUM BROMIDE 100 MG/10ML IV SOLN
INTRAVENOUS | Status: AC
  Filled 2014-03-18: qty 1

## 2014-03-18 MED ORDER — LIDOCAINE HCL (CARDIAC) 20 MG/ML IV SOLN
INTRAVENOUS | Status: AC
  Filled 2014-03-18: qty 5

## 2014-03-18 MED ORDER — GLYCOPYRROLATE 0.2 MG/ML IJ SOLN
INTRAMUSCULAR | Status: AC
  Filled 2014-03-18: qty 3

## 2014-03-18 MED ORDER — KETOROLAC TROMETHAMINE 30 MG/ML IJ SOLN
INTRAMUSCULAR | Status: AC
  Filled 2014-03-18: qty 1

## 2014-03-18 MED ORDER — FENTANYL CITRATE 0.05 MG/ML IJ SOLN
INTRAMUSCULAR | Status: AC
  Filled 2014-03-18: qty 5

## 2014-03-18 MED ORDER — ROCURONIUM BROMIDE 100 MG/10ML IV SOLN
INTRAVENOUS | Status: DC | PRN
  Administered 2014-03-18: 30 mg via INTRAVENOUS

## 2014-03-18 MED ORDER — BUPIVACAINE HCL (PF) 0.5 % IJ SOLN
INTRAMUSCULAR | Status: AC
  Filled 2014-03-18: qty 30

## 2014-03-18 MED ORDER — MEPERIDINE HCL 25 MG/ML IJ SOLN: 6.2500 mg | INTRAMUSCULAR | Status: DC | PRN

## 2014-03-18 MED ORDER — NALOXONE HCL 0.4 MG/ML IJ SOLN
0.0400 mg | Freq: Once | INTRAMUSCULAR | Status: AC
  Administered 2014-03-18: 0.04 mg via INTRAVENOUS

## 2014-03-18 MED ORDER — NALOXONE HCL 0.4 MG/ML IJ SOLN
0.0800 mg | Freq: Once | INTRAMUSCULAR | Status: AC
  Administered 2014-03-18: 0.08 mg via INTRAVENOUS

## 2014-03-18 MED ORDER — LACTATED RINGERS IV SOLN
INTRAVENOUS | Status: DC
  Administered 2014-03-18 (×2): via INTRAVENOUS

## 2014-03-18 MED ORDER — ACETAMINOPHEN 500 MG PO TABS
1000.0000 mg | ORAL_TABLET | Freq: Once | ORAL | Status: AC
  Administered 2014-03-18: 1000 mg via ORAL

## 2014-03-18 MED ORDER — METOCLOPRAMIDE HCL 5 MG/ML IJ SOLN: 10.0000 mg | Freq: Once | INTRAMUSCULAR | Status: DC | PRN

## 2014-03-18 MED ORDER — GLYCOPYRROLATE 0.2 MG/ML IJ SOLN
INTRAMUSCULAR | Status: DC | PRN
  Administered 2014-03-18: .4 mg via INTRAVENOUS

## 2014-03-18 MED ORDER — IBUPROFEN 600 MG PO TABS: 600.0000 mg | ORAL_TABLET | Freq: Four times a day (QID) | ORAL | Status: DC | PRN

## 2014-03-18 MED ORDER — PROPOFOL 10 MG/ML IV EMUL
INTRAVENOUS | Status: AC
  Filled 2014-03-18: qty 20

## 2014-03-18 SURGICAL SUPPLY — 31 items
APPLICATOR COTTON TIP 6IN STRL (MISCELLANEOUS) ×3 IMPLANT
BLADE SURG 11 STRL SS (BLADE) ×3 IMPLANT
CABLE HIGH FREQUENCY MONO STRZ (ELECTRODE) IMPLANT
CATH ROBINSON RED A/P 16FR (CATHETERS) ×3 IMPLANT
CHLORAPREP W/TINT 26ML (MISCELLANEOUS) ×3 IMPLANT
CLOTH BEACON ORANGE TIMEOUT ST (SAFETY) ×3 IMPLANT
DRSG COVADERM PLUS 2X2 (GAUZE/BANDAGES/DRESSINGS) ×6 IMPLANT
DRSG OPSITE POSTOP 3X4 (GAUZE/BANDAGES/DRESSINGS) IMPLANT
ELECT REM PT RETURN 9FT ADLT (ELECTROSURGICAL)
ELECTRODE REM PT RTRN 9FT ADLT (ELECTROSURGICAL) IMPLANT
FORCEPS CUTTING 33CM 5MM (CUTTING FORCEPS) IMPLANT
GLOVE BIO SURGEON STRL SZ 6.5 (GLOVE) ×3 IMPLANT
GOWN STRL REUS W/TWL LRG LVL3 (GOWN DISPOSABLE) ×6 IMPLANT
NDL SAFETY ECLIPSE 18X1.5 (NEEDLE) ×2 IMPLANT
NEEDLE HYPO 18GX1.5 SHARP (NEEDLE) ×1
NEEDLE INSUFFLATION 120MM (ENDOMECHANICALS) ×3 IMPLANT
NS IRRIG 1000ML POUR BTL (IV SOLUTION) ×3 IMPLANT
PACK LAPAROSCOPY BASIN (CUSTOM PROCEDURE TRAY) ×3 IMPLANT
POUCH SPECIMEN RETRIEVAL 10MM (ENDOMECHANICALS) IMPLANT
PROTECTOR NERVE ULNAR (MISCELLANEOUS) ×3 IMPLANT
SET IRRIG TUBING LAPAROSCOPIC (IRRIGATION / IRRIGATOR) IMPLANT
SHEARS HARMONIC ACE PLUS 36CM (ENDOMECHANICALS) ×3 IMPLANT
STRIP CLOSURE SKIN 1/2X4 (GAUZE/BANDAGES/DRESSINGS) ×3 IMPLANT
SUT VICRYL 0 UR6 27IN ABS (SUTURE) ×3 IMPLANT
SUT VICRYL 4-0 PS2 18IN ABS (SUTURE) ×6 IMPLANT
TOWEL OR 17X24 6PK STRL BLUE (TOWEL DISPOSABLE) ×6 IMPLANT
TROCAR OPTI TIP 5M 100M (ENDOMECHANICALS) ×6 IMPLANT
TROCAR XCEL DIL TIP R 11M (ENDOMECHANICALS) ×3 IMPLANT
TROCAR XCEL NON-BLD 5MMX100MML (ENDOMECHANICALS) IMPLANT
WARMER LAPAROSCOPE (MISCELLANEOUS) ×3 IMPLANT
WATER STERILE IRR 1000ML POUR (IV SOLUTION) ×3 IMPLANT

## 2014-03-18 NOTE — Transfer of Care (Signed)
Immediate Anesthesia Transfer of Care Note  Patient: Terri [name]  Procedure(s) Performed: Procedure(s): LAPAROSCOPIC BILATERAL SALPINGECTOMY (Bilateral) LAPAROSCOPIC LYSIS OF ADHESIONS (Bilateral)  Patient Location: PACU  Anesthesia Type:General  Level of Consciousness: awake, oriented and sedated  Airway & Oxygen Therapy: Patient Spontanous Breathing and Patient connected to nasal cannula oxygen  Post-op Assessment: Report given to PACU RN, Post -op Vital signs reviewed and stable and Patient moving all extremities X 4  Post vital signs: Reviewed and stable  Complications: No apparent anesthesia complications

## 2014-03-18 NOTE — Anesthesia Postprocedure Evaluation (Signed)
  Anesthesia Post-op Note  Patient: Terri [name]  Procedure(s) Performed: Procedure(s): LAPAROSCOPIC BILATERAL SALPINGECTOMY (Bilateral) LAPAROSCOPIC LYSIS OF ADHESIONS (Bilateral) Patient is awake and responsive. Pain and nausea are reasonably well controlled. Vital signs are stable and clinically acceptable. Oxygen saturation is clinically acceptable. There are no apparent anesthetic complications at this time. Patient is ready for discharge.

## 2014-03-18 NOTE — Op Note (Signed)
03/18/2014  12:17 PM  PATIENT:  Terri [name]  43 y.o. female  PRE-OPERATIVE DIAGNOSIS:  Undesired fertility  POST-OPERATIVE DIAGNOSIS:  Undesired fertility  PROCEDURE:  Procedure(s): LAPAROSCOPIC BILATERAL SALPINGECTOMY (Bilateral) LAPAROSCOPIC LYSIS OF ADHESIONS (Bilateral)  SURGEON:  Surgeon(s) and Role:    * Allie BossierMyra C Christofer Shen, MD - Primary  PHYSICIAN ASSISTANT:   ASSISTANTS: none   ANESTHESIA:   general  EBL:  Total I/O In: 1000 [I.V.:1000] Out: -   BLOOD ADMINISTERED:none  DRAINS: none   LOCAL MEDICATIONS USED:  MARCAINE     SPECIMEN:  Source of Specimen:  both oviducts  DISPOSITION OF SPECIMEN:  PATHOLOGY  COUNTS:  YES  TOURNIQUET:  * No tourniquets in log *  DICTATION: .Dragon Dictation  PLAN OF CARE: Discharge to home after PACU  PATIENT DISPOSITION:  PACU - hemodynamically stable.   Delay start of Pharmacological VTE agent (>24hrs) due to surgical blood loss or risk of bleeding: n/a  The risks, benefits, and alternatives of surgery were explained, understood, accepted. She is certain that she wants permanent sterility. She understands that this is not reversible. She understands there is a small failure rate of this procedure. She also would like to have both oviducts removed her due newest recommendations to help prevent ovarian/peritoneal cancer. In the operating room she was placed in the dorsal lithotomy position, and general anesthesia was given without complication. Her abdomen and vagina were prepped and draped in the usual sterile fashion. A timeout procedure was done. A bimanual exam revealed a normal size and shaped anteverted and mobile uterus. Her adnexa felt normal. A Hulka manipulator was placed. Her bladder was emptied with a Robinson catheter. Gloves were changed, and attention was turned to the abdomen. Approximately the 5 mL of 0.5% Marcaine was injected into the umbilicus. A vertical incision was made at the site. A varies needle was placed  intraperitoneally. Low-flow CO2 was used to insufflate the abdomen to approximately 3-1/2 L. Once a good pneumoperitoneum was established, a 10 mm Excel trocar was placed. Laparoscopy confirmed correct placement. A 5 mm port was placed in each lower quadrant under direct laparoscopic visualization after injecting 0.5% Marcaine in the incision sites. Her pelvis and upper abdomen appeared normal with the exception of moderately dense adhesions tethering the right adnexa deep into the pelvis. I used the Harmonic scapel to release these adhesions.  A Harmonic scapel was used to hemostatically removed both oviducts. I switched to a 5 mm camera and removed the oviducts through the umbilical port.  I removed the 5 mm ports and noted hemostasis. The umbilical fascia was closed with a 0 vicryl suture. No defects were palpable. A subcuticular closure was done with 4-0 Vicryl suture at all incision sites. A Steri-Strip was placed across each incision. She was extubated and taken to the recovery room in stable condition.

## 2014-03-18 NOTE — Anesthesia Preprocedure Evaluation (Signed)
Anesthesia Evaluation  Patient identified by MRN, date of birth, ID band Patient awake    Reviewed: Allergy & Precautions, H&P , NPO status , Patient's Chart, lab work & pertinent test results  Airway Mallampati: II TM Distance: >3 FB Neck ROM: Full    Dental no notable dental hx. (+) Teeth Intact   Pulmonary neg pulmonary ROS,    Pulmonary exam normal       Cardiovascular hypertension, negative cardio ROS  Rhythm:Regular Rate:Normal     Neuro/Psych  Headaches, negative psych ROS   GI/Hepatic negative GI ROS, Neg liver ROS,   Endo/Other  Obesity  Renal/GU negative Renal ROS  negative genitourinary   Musculoskeletal negative musculoskeletal ROS (+)   Abdominal (+) + obese,   Peds  Hematology negative hematology ROS (+)   Anesthesia Other Findings   Reproductive/Obstetrics Undesired fertility                           Anesthesia Physical Anesthesia Plan  ASA: II  Anesthesia Plan: General   Post-op Pain Management:    Induction:   Airway Management Planned: Natural Airway  Additional Equipment:   Intra-op Plan:   Post-operative Plan: Extubation in OR  Informed Consent: I have reviewed the patients History and Physical, chart, labs and discussed the procedure including the risks, benefits and alternatives for the proposed anesthesia with the patient or authorized representative who has indicated his/her understanding and acceptance.   Dental advisory given  Plan Discussed with: CRNA, Anesthesiologist and Surgeon  Anesthesia Plan Comments:         Anesthesia Quick Evaluation

## 2014-03-18 NOTE — H&P (Signed)
Terri [name] is an 43 y.o. S AA G4P1 43(43 yo daughter) female. She is here today to have laparoscopy and removal of both of her oviducts removed for permanent sterility and to get the benefit of decreased risk of ovarian cancer in the future.  Pertinent Gynecological History: Menses: flow is moderate Bleeding: normal for 4-5 days per month Contraception: condoms DES exposure: denies Blood transfusions: none Sexually transmitted diseases: no past history Previous GYN Procedures: none  Last mammogram: normal Date: 2014 Last pap: normal Date: 2015    Menstrual History: Menarche age: [age] Patient's last menstrual period was 03/18/2014.    Past Medical History  Diagnosis Date  . Hypertension   . Migraine   . Abnormal Pap smear of cervix     cryotherapy    Past Surgical History  Procedure Laterality Date  . No past surgeries      Family History  Problem Relation Age of Onset  . Hypertension Mother   . Cancer Maternal Aunt 50    colon Cancer  . Cancer Paternal Aunt 850    brest cancer  . Heart disease Maternal Grandmother   . Stroke Maternal Grandmother   . Stroke Paternal Grandmother     Social History:  reports that she has never smoked. She has never used smokeless tobacco. She reports that she does not drink alcohol or use illicit drugs.  Allergies: No Known Allergies  Prescriptions prior to admission  Medication Sig Dispense Refill  . aspirin-acetaminophen-caffeine (EXCEDRIN MIGRAINE) 250-250-65 MG per tablet Take 1 tablet by mouth every 6 (six) hours as needed for pain.      . Aspirin-Acetaminophen-Caffeine (GOODY HEADACHE PO) Take by mouth.      . metoprolol succinate (TOPROL-XL) 50 MG 24 hr tablet Take 1 tablet (50 mg total) by mouth daily. Take with or immediately following a meal.  90 tablet  3  . sodium chloride (OCEAN) 0.65 % nasal spray Place 1 spray into the nose as needed for congestion.  30 mL  0  . Vitamin D, Ergocalciferol, (DRISDOL) 50000 UNITS CAPS  Take 50,000 Units by mouth.        [employer]  Blood pressure 156/91, pulse 70, temperature 98.1 F (36.7 C), temperature source Oral, resp. rate 18, last menstrual period 03/18/2014, SpO2 97.00%. Physical Exam Heart-rrr Lungs- CTAB Abd- benign  No results found for this or any previous visit (from the past 24 hour(s)).  No results found.  Assessment/Plan: Multiparity, desires sterility. I will plan to do a laparoscopic bilateral salpingectomy.  She understands the risks of surgery, including, but not to infection, bleeding, DVTs, damage to bowel, bladder, ureters. She wishes to proceed.     Ulys Favia C. 03/18/2014, 10:46 AM

## 2014-03-18 NOTE — Discharge Instructions (Signed)
Diagnostic Laparoscopy Laparoscopy is a surgical procedure. It is used to diagnose and treat diseases inside the belly (abdomen). It is usually a brief, common, and relatively simple procedure. The laparoscopeis a thin, lighted, pencil-sized instrument. It is like a telescope. It is inserted into your abdomen through a small cut (incision). Your caregiver can look at the organs inside your body through this instrument. He or she can see if there is anything abnormal. Laparoscopy can be done either in a hospital or outpatient clinic. You may be given a mild sedative to help you relax before the procedure. Once in the operating room, you will be given a drug to make you sleep (general anesthesia). Laparoscopy usually lasts less than 1 hour. After the procedure, you will be monitored in a recovery area until you are stable and doing well. Once you are home, it will take 2 to 3 days to fully recover. RISKS AND COMPLICATIONS  Laparoscopy has relatively few risks. Your caregiver will discuss the risks with you before the procedure. Some problems that can occur include:  Infection.  Bleeding.  Damage to other organs.  Anesthetic side effects. PROCEDURE Once you receive anesthesia, your surgeon inflates the abdomen with a harmless gas (carbon dioxide). This makes the organs easier to see. The laparoscope is inserted into the abdomen through a small incision. This allows your surgeon to see into the abdomen. Other small instruments are also inserted into the abdomen through other small openings. Many surgeons attach a video camera to the laparoscope to enlarge the view. During a diagnostic laparoscopy, the surgeon may be looking for inflammation, infection, or cancer. Your surgeon may take tissue samples(biopsies). The samples are sent to a specialist in looking at cells and tissue samples (pathologist). The pathologist examines them under a microscope. Biopsies can help to diagnose or confirm a  disease. AFTER THE PROCEDURE   The gas is released from inside the abdomen.  The incisions are closed with stitches (sutures). Because these incisions are small (usually less than 1/2 inch), there is usually minimal discomfort after the procedure. There may be some mild discomfort in the throat. This is from the tube placed in the throat while you were sleeping. You may have some mild abdominal discomfort. There may also be discomfort from the instrument placement incisions in the abdomen.  The recovery time is shortened as long as there are no complications.  You will rest in a recovery room until stable and doing well. As long as there are no complications, you may be allowed to go home. FINDING OUT THE RESULTS OF YOUR TEST Not all test results are available during your visit. If your test results are not back during the visit, make an appointment with your caregiver to find out the results. Do not assume everything is normal if you have not heard from your caregiver or the medical facility. It is important for you to follow up on all of your test results. HOME CARE INSTRUCTIONS   Take all medicines as directed.  Only take over-the-counter or prescription medicines for pain, discomfort, or fever as directed by your caregiver.  Resume daily activities as directed.  Showers are preferred over baths.  You may resume sexual activities in 1 week or as directed.  Do not drive while taking narcotics. SEEK MEDICAL CARE IF:   There is increasing abdominal pain.  There is new pain in the shoulders (shoulder strap areas).  You feel lightheaded or faint.  You have the chills.  You or  your child has an oral temperature above 102 F (38.9 C).  There is pus-like (purulent) drainage from any of the wounds.  You are unable to pass gas or have a bowel movement.  You feel sick to your stomach (nauseous) or throw up (vomit). MAKE SURE YOU:   Understand these instructions.  Will watch  your condition.  Will get help right away if you are not doing well or get worse. Document Released: 10/24/2000 Document Revised: 11/12/2012 Document Reviewed: 07/18/2007 [facility] Patient Information 2015 [employer] [place]. This information is not intended to replace advice given to you by your health care provider. Make sure you discuss any questions you have with your health care provider.   NO SEX for 2 WEEKS.   DISCHARGE INSTRUCTIONS: Laparoscopy  The following instructions have been prepared to help you care for yourself upon your return home today.  Wound care:  Do not get the incision wet for the first 24 hours. The incision should be kept clean and dry.  The Band-Aids or dressings may be removed the day after surgery.  Should the incision become sore, red, and swollen after the first week, check with your doctor.  Personal hygiene:  Shower the day after your procedure.  Activity and limitations:  Do NOT drive or operate any equipment today.  Do NOT lift anything more than 15 pounds for 2-3 weeks after surgery.  Do NOT rest in bed all day.  Walking is encouraged. Walk each day, starting slowly with 5-minute walks 3 or 4 times a day. Slowly increase the length of your walks.  Walk up and down stairs slowly.  Do NOT do strenuous activities, such as golfing, playing tennis, bowling, running, biking, weight lifting, gardening, mowing, or vacuuming for 2-4 weeks. Ask your doctor when it is okay to start.  Diet: Eat a light meal as desired this evening. You may resume your usual diet tomorrow.  Return to work: This is dependent on the type of work you do. For the most part you can return to a desk job within a week of surgery. If you are more active at work, please discuss this with your doctor.  What to expect after your surgery: You may have a slight burning sensation when you urinate on the first day. You may have a very small amount of blood in the urine. Expect to have  a small amount of vaginal discharge/light bleeding for 1-2 weeks. It is not unusual to have abdominal soreness and bruising for up to 2 weeks. You may be tired and need more rest for about 1 week. You may experience shoulder pain for 24-72 hours. Lying flat in bed may relieve it.  Call your doctor for any of the following:  Develop a fever of 100.4 or greater  Inability to urinate 6 hours after discharge from hospital  Severe pain not relieved by pain medications  Persistent of heavy bleeding at incision site  Redness or swelling around incision site after a week  Increasing nausea or vomiting  Patient Signature________________________________________ Nurse Signature_________________________________________

## 2014-03-19 ENCOUNTER — Encounter (HOSPITAL_COMMUNITY): Payer: Self-pay | Admitting: Obstetrics & Gynecology

## 2014-05-05 ENCOUNTER — Ambulatory Visit (INDEPENDENT_AMBULATORY_CARE_PROVIDER_SITE_OTHER): Payer: 59 | Admitting: Obstetrics & Gynecology

## 2014-05-05 ENCOUNTER — Encounter: Payer: Self-pay | Admitting: Obstetrics & Gynecology

## 2014-05-05 VITALS — BP 175/99 | HR 84 | Resp 16 | Ht 59.0 in | Wt 171.0 lb

## 2014-05-05 DIAGNOSIS — Z9889 Other specified postprocedural states: Secondary | ICD-10-CM

## 2014-05-05 DIAGNOSIS — Z0000 Encounter for general adult medical examination without abnormal findings: Secondary | ICD-10-CM

## 2014-05-05 NOTE — Progress Notes (Signed)
   Subjective:    Patient ID: Terri [name], female    DOB: 12/21/1970, 43 y.o.   MRN: [id number]  HPI  43 yo M AA lady now about 6 weeks post op s/p laparoscopic BS and LOA. She is doing well, returned to work on POD #2, had sex after her NMP without problems.   Review of Systems     Objective:   Physical Exam Incisions healing well Abd- benign Pathology benign       Assessment & Plan:  Post op-doing well Preventative care- schedule mammogram RTC 1 year prn/sooner

## 2014-05-15 ENCOUNTER — Encounter: Payer: Self-pay | Admitting: Internal Medicine

## 2014-05-15 ENCOUNTER — Ambulatory Visit (INDEPENDENT_AMBULATORY_CARE_PROVIDER_SITE_OTHER): Payer: 59 | Admitting: Internal Medicine

## 2014-05-15 VITALS — BP 155/97 | HR 80 | Temp 97.8°F | Resp 16 | Ht 59.0 in | Wt 169.0 lb

## 2014-05-15 DIAGNOSIS — G441 Vascular headache, not elsewhere classified: Secondary | ICD-10-CM

## 2014-05-15 DIAGNOSIS — I10 Essential (primary) hypertension: Secondary | ICD-10-CM

## 2014-05-15 LAB — COMPLETE METABOLIC PANEL WITH GFR
ALBUMIN: 4.2 g/dL (ref 3.5–5.2)
ALT: 18 U/L (ref 0–35)
AST: 22 U/L (ref 0–37)
Alkaline Phosphatase: 58 U/L (ref 39–117)
BUN: 13 mg/dL (ref 6–23)
CHLORIDE: 103 meq/L (ref 96–112)
CO2: 22 mEq/L (ref 19–32)
Calcium: 9.5 mg/dL (ref 8.4–10.5)
Creat: 0.92 mg/dL (ref 0.50–1.10)
GFR, EST NON AFRICAN AMERICAN: 77 mL/min
GFR, Est African American: 88 mL/min
GLUCOSE: 86 mg/dL (ref 70–99)
POTASSIUM: 3.9 meq/L (ref 3.5–5.3)
Sodium: 135 mEq/L (ref 135–145)
TOTAL PROTEIN: 7.1 g/dL (ref 6.0–8.3)
Total Bilirubin: 0.8 mg/dL (ref 0.2–1.2)

## 2014-05-15 LAB — POCT URINALYSIS DIPSTICK
BILIRUBIN UA: NEGATIVE
GLUCOSE UA: NEGATIVE
LEUKOCYTES UA: NEGATIVE
Nitrite, UA: NEGATIVE
Protein, UA: NEGATIVE
Spec Grav, UA: 1.025
UROBILINOGEN UA: NEGATIVE
pH, UA: 6.5

## 2014-05-15 LAB — CBC WITH DIFFERENTIAL/PLATELET
Basophils Absolute: 0 10*3/uL (ref 0.0–0.1)
Basophils Relative: 0 % (ref 0–1)
Eosinophils Absolute: 0 10*3/uL (ref 0.0–0.7)
Eosinophils Relative: 0 % (ref 0–5)
HEMATOCRIT: 36.7 % (ref 36.0–46.0)
HEMOGLOBIN: 12 g/dL (ref 12.0–15.0)
LYMPHS PCT: 32 % (ref 12–46)
Lymphs Abs: 2.4 10*3/uL (ref 0.7–4.0)
MCH: 26.4 pg (ref 26.0–34.0)
MCHC: 32.7 g/dL (ref 30.0–36.0)
MCV: 80.8 fL (ref 78.0–100.0)
MONOS PCT: 8 % (ref 3–12)
Monocytes Absolute: 0.6 10*3/uL (ref 0.1–1.0)
NEUTROS ABS: 4.5 10*3/uL (ref 1.7–7.7)
Neutrophils Relative %: 60 % (ref 43–77)
Platelets: 343 10*3/uL (ref 150–400)
RBC: 4.54 MIL/uL (ref 3.87–5.11)
RDW: 17.7 % — ABNORMAL HIGH (ref 11.5–15.5)
WBC: 7.5 10*3/uL (ref 4.0–10.5)

## 2014-05-15 LAB — TSH: TSH: 1.874 u[IU]/mL (ref 0.350–4.500)

## 2014-05-15 LAB — LIPID PANEL
Cholesterol: 147 mg/dL (ref 0–200)
HDL: 45 mg/dL (ref >39)
LDL Cholesterol: 88 mg/dL (ref 0–99)
Total CHOL/HDL Ratio: 3.3 Ratio
Triglycerides: 68 mg/dL (ref <150)
VLDL: 14 mg/dL (ref 0–40)

## 2014-05-15 MED ORDER — LABETALOL HCL 100 MG PO TABS: 100.0000 mg | ORAL_TABLET | Freq: Two times a day (BID) | ORAL | Status: DC

## 2014-05-15 MED ORDER — HYDROCHLOROTHIAZIDE 25 MG PO TABS: 25.0000 mg | ORAL_TABLET | Freq: Every day | ORAL | Status: DC

## 2014-05-15 NOTE — Progress Notes (Signed)
Subjective:    Patient ID: Terri [name], female    DOB: 08/14/1970, 43 y.o.   MRN: [id number]  HPI 01/2013 note HTN Improved Continue daily toprol  Conjunctivitis Improved  See me as needed      Today   Terri [name] is here for acute visit.  Here with her boyfriend.  I have not seen Terri [name] in over a year.    She tells me her BP has been elevated at work with systolic ranging 086'V150's to 190.  She was seen at UC one week ago  BP was 148/98 and she was told to increase her metoprolol to bid which she has done  Occasinal headhace No chest pain No dizziness.   No Known Allergies Past Medical History  Diagnosis Date  . Hypertension   . Migraine   . Abnormal Pap smear of cervix     cryotherapy   Past Surgical History  Procedure Laterality Date  . No past surgeries    . Laparoscopic bilateral salpingectomy Bilateral 03/18/2014    Procedure: LAPAROSCOPIC BILATERAL SALPINGECTOMY;  Surgeon: Allie BossierMyra C Dove, MD;  Location: [facility];  Service: Gynecology;  Laterality: Bilateral;  . Laparoscopic lysis of adhesions Bilateral 03/18/2014    Procedure: LAPAROSCOPIC LYSIS OF ADHESIONS;  Surgeon: Allie BossierMyra C Dove, MD;  Location: [facility];  Service: Gynecology;  Laterality: Bilateral;   History   Social History  . Marital Status: Single    Spouse Name: N/A    Number of Children: N/A  . Years of Education: N/A   Occupational History  . Not on file.   Social History Main Topics  . Smoking status: Never Smoker   . Smokeless tobacco: Never Used  . Alcohol Use: No  . Drug Use: No  . Sexual Activity: Yes    Birth Control/ Protection: Condom   Other Topics Concern  . Not on file   Social History Narrative  . No narrative on file   Family History  Problem Relation Age of Onset  . Hypertension Mother   . Cancer Maternal Aunt 50    colon Cancer  . Cancer Paternal Aunt 4150    brest cancer  . Heart disease Maternal Grandmother   . Stroke Maternal Grandmother   . Stroke Paternal Grandmother    Patient  Active Problem List   Diagnosis Date Noted  . Unspecified vitamin D deficiency 10/15/2012  . Essential hypertension, benign 09/24/2012  . History of migraine headaches 09/24/2012  . Overweight 09/24/2012   Current Outpatient Prescriptions on File Prior to Visit  Medication Sig Dispense Refill  . aspirin-acetaminophen-caffeine (EXCEDRIN MIGRAINE) 250-250-65 MG per tablet Take 1 tablet by mouth every 6 (six) hours as needed for pain.      . Aspirin-Acetaminophen-Caffeine (GOODY HEADACHE PO) Take by mouth.      Marland Kitchen. ibuprofen (ADVIL,MOTRIN) 600 MG tablet Take 1 tablet (600 mg total) by mouth every 6 (six) hours as needed.  30 tablet  1  . metoprolol succinate (TOPROL-XL) 50 MG 24 hr tablet Take 1 tablet (50 mg total) by mouth daily. Take with or immediately following a meal.  90 tablet  3  . oxyCODONE-acetaminophen (PERCOCET/ROXICET) 5-325 MG per tablet Take 1-2 tablets by mouth every 6 (six) hours as needed.  30 tablet  0   No current facility-administered medications on file prior to visit.       Review of Systems See HPI    Objective:   Physical Exam  Physical Exam  Nursing note and vitals reviewed.  Repeat BP  170/96 my exam (prior to clonidine) Constitutional: She is oriented to person, place, and time. She appears well-developed and well-nourished.  HENT:  Head: Normocephalic and atraumatic.  Cardiovascular: Normal rate and regular rhythm. Exam reveals no gallop and no friction rub.  No murmur heard.  Pulmonary/Chest: Breath sounds normal. She has no wheezes. She has no rales.  Neurological: She is alert and oriented to person, place, and time.  Skin: Skin is warm and dry.  Psychiatric: She has a normal mood and affect. Her behavior is normal.             Assessment & Plan:  HTn not at goal  Will give clonidine 0.1 mg in office.  D/C  Metoprolol.  Will start labetolol 100 mg bid with HCTZ 25 mg daily    BP improved after clonidine.    Headache  Likely due to BP    Will see me next week

## 2014-05-15 NOTE — Patient Instructions (Signed)
Take labetalol 100 mg twice a day   Take HCTZ 25 mg once a day in am   See me next Tuesday

## 2014-05-16 LAB — VITAMIN D 25 HYDROXY (VIT D DEFICIENCY, FRACTURES): Vit D, 25-Hydroxy: 34 ng/mL (ref 30–89)

## 2014-05-17 ENCOUNTER — Encounter: Payer: Self-pay | Admitting: Internal Medicine

## 2014-05-19 ENCOUNTER — Ambulatory Visit: Payer: 59 | Admitting: Internal Medicine

## 2014-05-20 ENCOUNTER — Emergency Department (HOSPITAL_BASED_OUTPATIENT_CLINIC_OR_DEPARTMENT_OTHER): Payer: 59

## 2014-05-20 ENCOUNTER — Encounter: Payer: Self-pay | Admitting: Internal Medicine

## 2014-05-20 ENCOUNTER — Ambulatory Visit (INDEPENDENT_AMBULATORY_CARE_PROVIDER_SITE_OTHER): Payer: 59 | Admitting: Internal Medicine

## 2014-05-20 ENCOUNTER — Encounter (HOSPITAL_BASED_OUTPATIENT_CLINIC_OR_DEPARTMENT_OTHER): Payer: Self-pay | Admitting: Emergency Medicine

## 2014-05-20 ENCOUNTER — Emergency Department (HOSPITAL_BASED_OUTPATIENT_CLINIC_OR_DEPARTMENT_OTHER)
Admission: EM | Admit: 2014-05-20 | Discharge: 2014-05-20 | Disposition: A | Discharge status: Home / Self Care | Payer: 59 | Attending: Emergency Medicine | Admitting: Emergency Medicine

## 2014-05-20 VITALS — BP 176/107 | HR 97 | Temp 98.7°F | Resp 16 | Ht 59.0 in | Wt 167.0 lb

## 2014-05-20 DIAGNOSIS — G44049 Chronic paroxysmal hemicrania, not intractable: Secondary | ICD-10-CM | POA: Diagnosis not present

## 2014-05-20 DIAGNOSIS — R51 Headache: Secondary | ICD-10-CM | POA: Diagnosis present

## 2014-05-20 DIAGNOSIS — I10 Essential (primary) hypertension: Secondary | ICD-10-CM | POA: Diagnosis not present

## 2014-05-20 DIAGNOSIS — Z79899 Other long term (current) drug therapy: Secondary | ICD-10-CM | POA: Diagnosis not present

## 2014-05-20 DIAGNOSIS — G4459 Other complicated headache syndrome: Secondary | ICD-10-CM

## 2014-05-20 DIAGNOSIS — G44039 Episodic paroxysmal hemicrania, not intractable: Secondary | ICD-10-CM

## 2014-05-20 MED ORDER — KETOROLAC TROMETHAMINE 60 MG/2ML IM SOLN
60.0000 mg | Freq: Once | INTRAMUSCULAR | Status: AC
  Administered 2014-05-20: 60 mg via INTRAMUSCULAR
  Filled 2014-05-20: qty 2

## 2014-05-20 MED ORDER — LOSARTAN POTASSIUM-HCTZ 100-25 MG PO TABS: 1.0000 | ORAL_TABLET | Freq: Every day | ORAL | Status: DC

## 2014-05-20 NOTE — ED Notes (Signed)
Patient drinking ginger ale, no difficulty swallowing

## 2014-05-20 NOTE — Progress Notes (Signed)
   Subjective:    Patient ID: Terri [name], female    DOB: 04/04/1971, 43 y.o.   MRN: [id number]  HPI Prior note  HTn not at goal Will give clonidine 0.1 mg in office. D/C Metoprolol. Will start labetolol 100 mg bid with HCTZ 25 mg daily  BP improved after clonidine.  Headache Likely due to BP  Will see me next week  Today   Since last visit BP still in 170's 180's systolic  ( checked by nurse at work )  Woke up at 1 am with severe headache.  No visual  ,  Speech or muscle weakness.  Has been on labetolol 100 mg bid since yesterday   Upon review of record  Pre-op BP was 150/84   And post op check with GYN noted to be 175/99.  BP has been elevated since 10/5 visit    Review of Systems    see HPI Objective:   Physical Exam  Physical Exam  Nursing note and vitals reviewed.  Constitutional: She is oriented to person, place, and time. She appears well-developed and well-nourished.  HENT:  Head: Normocephalic and atraumatic.  Cardiovascular: Normal rate and regular rhythm. Exam reveals no gallop and no friction rub.  No murmur heard.  Pulmonary/Chest: Breath sounds normal. She has no wheezes. She has no rales.  Neurological: She is alert and oriented to person, place, and time.  CNII-XII intact Motor 5/5 UE and LE Cerebellar intact FTN Sensory intact to PP Reflexes 2+ symmetric  Skin: Skin is warm and dry.  Psychiatric: She has a normal mood and affect. Her behavior is normal.      Assessment & Plan:  With new onset headahce will send to ER for Head Ct  Spoke with Dr. Fredderick PhenixBelfi     Continue Labetolol 100 mb 2 bid and will add Hyzaar  100/25 daily  (hold HCTZ that pt has )  See me next Monday or sooner prn   Will need work up for secondary causes

## 2014-05-20 NOTE — ED Notes (Signed)
Headache that started this am 0100 and no relief after taking BP medication.  Discussed BP dosage yesterday with PMD and instructed to increase dose.  Seen by PMD this am and sent to ED for further evaluation and CT head.

## 2014-05-20 NOTE — Patient Instructions (Addendum)
See me Monday 30 min   To ER today

## 2014-05-20 NOTE — Discharge Instructions (Signed)
Hypertension °Hypertension, commonly called high blood pressure, is when the force of blood pumping through your arteries is too strong. Your arteries are the blood vessels that carry blood from your heart throughout your body. A blood pressure reading consists of a higher number over a lower number, such as 110/72. The higher number (systolic) is the pressure inside your arteries when your heart pumps. The lower number (diastolic) is the pressure inside your arteries when your heart relaxes. Ideally you want your blood pressure below 120/80. °Hypertension forces your heart to work harder to pump blood. Your arteries may become narrow or stiff. Having hypertension puts you at risk for heart disease, stroke, and other problems.  °RISK FACTORS °Some risk factors for high blood pressure are controllable. Others are not.  °Risk factors you cannot control include:  °· Race. You may be at higher risk if you are African American. °· Age. Risk increases with age. °· Gender. Men are at higher risk than women before age 45 years. After age 65, women are at higher risk than men. °Risk factors you can control include: °· Not getting enough exercise or physical activity. °· Being overweight. °· Getting too much fat, sugar, calories, or salt in your diet. °· Drinking too much alcohol. °SIGNS AND SYMPTOMS °Hypertension does not usually cause signs or symptoms. Extremely high blood pressure (hypertensive crisis) may cause headache, anxiety, shortness of breath, and nosebleed. °DIAGNOSIS  °To check if you have hypertension, your health care provider will measure your blood pressure while you are seated, with your arm held at the level of your heart. It should be measured at least twice using the same arm. Certain conditions can cause a difference in blood pressure between your right and left arms. A blood pressure reading that is higher than normal on one occasion does not mean that you need treatment. If one blood pressure reading  is high, ask your health care provider about having it checked again. °TREATMENT  °Treating high blood pressure includes making lifestyle changes and possibly taking medicine. Living a healthy lifestyle can help lower high blood pressure. You may need to change some of your habits. °Lifestyle changes may include: °· Following the DASH diet. This diet is high in fruits, vegetables, and whole grains. It is low in salt, red meat, and added sugars. °· Getting at least 2½ hours of brisk physical activity every week. °· Losing weight if necessary. °· Not smoking. °· Limiting alcoholic beverages. °· Learning ways to reduce stress. ° If lifestyle changes are not enough to get your blood pressure under control, your health care provider may prescribe medicine. You may need to take more than one. Work closely with your health care provider to understand the risks and benefits. °HOME CARE INSTRUCTIONS °· Have your blood pressure rechecked as directed by your health care provider.   °· Take medicines only as directed by your health care provider. Follow the directions carefully. Blood pressure medicines must be taken as prescribed. The medicine does not work as well when you skip doses. Skipping doses also puts you at risk for problems.   °· Do not smoke.   °· Monitor your blood pressure at home as directed by your health care provider.  °SEEK MEDICAL CARE IF:  °· You think you are having a reaction to medicines taken. °· You have recurrent headaches or feel dizzy. °· You have swelling in your ankles. °· You have trouble with your vision. °SEEK IMMEDIATE MEDICAL CARE IF: °· You develop a severe headache or confusion. °·   You have unusual weakness, numbness, or feel faint. °· You have severe chest or abdominal pain. °· You vomit repeatedly. °· You have trouble breathing. °MAKE SURE YOU:  °· Understand these instructions. °· Will watch your condition. °· Will get help right away if you are not doing well or get worse. °Document  Released: 07/18/2005 Document Revised: 12/02/2013 Document Reviewed: 05/10/2013 °ExitCare® Patient Information ©2015 [employer]. This information is not intended to replace advice given to you by your health care provider. Make sure you discuss any questions you have with your health care provider. ° °Headaches, Frequently Asked Questions °MIGRAINE HEADACHES °Q: What is migraine? What causes it? How can I treat it? °A: Generally, migraine headaches begin as a dull ache. Then they develop into a constant, throbbing, and pulsating pain. You may experience pain at the temples. You may experience pain at the front or back of one or both sides of the head. The pain is usually accompanied by a combination of: °· Nausea. °· Vomiting. °· Sensitivity to light and noise. °Some people (about 15%) experience an aura (see below) before an attack. The cause of migraine is believed to be chemical reactions in the brain. Treatment for migraine may include over-the-counter or prescription medications. It may also include self-help techniques. These include relaxation training and biofeedback.  °Q: What is an aura? °A: About 15% of people with migraine get an "aura". This is a sign of neurological symptoms that occur before a migraine headache. You may see wavy or jagged lines, dots, or flashing lights. You might experience tunnel vision or blind spots in one or both eyes. The aura can include visual or auditory hallucinations (something imagined). It may include disruptions in smell (such as strange odors), taste or touch. Other symptoms include: °· Numbness. °· A "pins and needles" sensation. °· Difficulty in recalling or speaking the correct word. °These neurological events may last as long as 60 minutes. These symptoms will fade as the headache begins. °Q: What is a trigger? °A: Certain physical or environmental factors can lead to or "trigger" a migraine. These include: °· Foods. °· Hormonal  changes. °· Weather. °· Stress. °It is important to remember that triggers are different for everyone. To help prevent migraine attacks, you need to figure out which triggers affect you. Keep a headache diary. This is a good way to track triggers. The diary will help you talk to your healthcare professional about your condition. °Q: Does weather affect migraines? °A: Bright sunshine, hot, humid conditions, and drastic changes in barometric pressure may lead to, or "trigger," a migraine attack in some people. But studies have shown that weather does not act as a trigger for everyone with migraines. °Q: What is the link between migraine and hormones? °A: Hormones start and regulate many of your body's functions. Hormones keep your body in balance within a constantly changing environment. The levels of hormones in your body are unbalanced at times. Examples are during menstruation, pregnancy, or menopause. That can lead to a migraine attack. In fact, about three quarters of all women with migraine report that their attacks are related to the menstrual cycle.  °Q: Is there an increased risk of stroke for migraine sufferers? °A: The likelihood of a migraine attack causing a stroke is very remote. That is not to say that migraine sufferers cannot have a stroke associated with their migraines. In persons under age 40, the most common associated factor for stroke is migraine headache. But over the course of a   person's normal life span, the occurrence of migraine headache may actually be associated with a reduced risk of dying from cerebrovascular disease due to stroke.  °Q: What are acute medications for migraine? °A: Acute medications are used to treat the pain of the headache after it has started. Examples over-the-counter medications, NSAIDs, ergots, and triptans.  °Q: What are the triptans? °A: Triptans are the newest class of abortive medications. They are specifically targeted to treat migraine. Triptans are  vasoconstrictors. They moderate some chemical reactions in the brain. The triptans work on receptors in your brain. Triptans help to restore the balance of a neurotransmitter called serotonin. Fluctuations in levels of serotonin are thought to be a main cause of migraine.  °Q: Are over-the-counter medications for migraine effective? °A: Over-the-counter, or "OTC," medications may be effective in relieving mild to moderate pain and associated symptoms of migraine. But you should see your caregiver before beginning any treatment regimen for migraine.  °Q: What are preventive medications for migraine? °A: Preventive medications for migraine are sometimes referred to as "prophylactic" treatments. They are used to reduce the frequency, severity, and length of migraine attacks. Examples of preventive medications include antiepileptic medications, antidepressants, beta-blockers, calcium channel blockers, and NSAIDs (nonsteroidal anti-inflammatory drugs). °Q: Why are anticonvulsants used to treat migraine? °A: During the past few years, there has been an increased interest in antiepileptic drugs for the prevention of migraine. They are sometimes referred to as "anticonvulsants". Both epilepsy and migraine may be caused by similar reactions in the brain.  °Q: Why are antidepressants used to treat migraine? °A: Antidepressants are typically used to treat people with depression. They may reduce migraine frequency by regulating chemical levels, such as serotonin, in the brain.  °Q: What alternative therapies are used to treat migraine? °A: The term "alternative therapies" is often used to describe treatments considered outside the scope of conventional Western medicine. Examples of alternative therapy include acupuncture, acupressure, and yoga. Another common alternative treatment is herbal therapy. Some herbs are believed to relieve headache pain. Always discuss alternative therapies with your caregiver before proceeding. Some  herbal products contain arsenic and other toxins. °TENSION HEADACHES °Q: What is a tension-type headache? What causes it? How can I treat it? °A: Tension-type headaches occur randomly. They are often the result of temporary stress, anxiety, fatigue, or anger. Symptoms include soreness in your temples, a tightening band-like sensation around your head (a "vice-like" ache). Symptoms can also include a pulling feeling, pressure sensations, and contracting head and neck muscles. The headache begins in your forehead, temples, or the back of your head and neck. Treatment for tension-type headache may include over-the-counter or prescription medications. Treatment may also include self-help techniques such as relaxation training and biofeedback. °CLUSTER HEADACHES °Q: What is a cluster headache? What causes it? How can I treat it? °A: Cluster headache gets its name because the attacks come in groups. The pain arrives with little, if any, warning. It is usually on one side of the head. A tearing or bloodshot eye and a runny nose on the same side of the headache may also accompany the pain. Cluster headaches are believed to be caused by chemical reactions in the brain. They have been described as the most severe and intense of any headache type. Treatment for cluster headache includes prescription medication and oxygen. °SINUS HEADACHES °Q: What is a sinus headache? What causes it? How can I treat it? °A: When a cavity in the bones of the face and skull (a sinus) becomes   inflamed, the inflammation will cause localized pain. This condition is usually the result of an allergic reaction, a tumor, or an infection. If your headache is caused by a sinus blockage, such as an infection, you will probably have a fever. An x-ray will confirm a sinus blockage. Your caregiver's treatment might include antibiotics for the infection, as well as antihistamines or decongestants.  °REBOUND HEADACHES °Q: What is a rebound headache? What  causes it? How can I treat it? °A: A pattern of taking acute headache medications too often can lead to a condition known as "rebound headache." A pattern of taking too much headache medication includes taking it more than 2 days per week or in excessive amounts. That means more than the label or a caregiver advises. With rebound headaches, your medications not only stop relieving pain, they actually begin to cause headaches. Doctors treat rebound headache by tapering the medication that is being overused. Sometimes your caregiver will gradually substitute a different type of treatment or medication. Stopping may be a challenge. Regularly overusing a medication increases the potential for serious side effects. Consult a caregiver if you regularly use headache medications more than 2 days per week or more than the label advises. °ADDITIONAL QUESTIONS AND ANSWERS °Q: What is biofeedback? °A: Biofeedback is a self-help treatment. Biofeedback uses special equipment to monitor your body's involuntary physical responses. Biofeedback monitors: °· Breathing. °· Pulse. °· Heart rate. °· Temperature. °· Muscle tension. °· Brain activity. °Biofeedback helps you refine and perfect your relaxation exercises. You learn to control the physical responses that are related to stress. Once the technique has been mastered, you do not need the equipment any more. °Q: Are headaches hereditary? °A: Four out of five (80%) of people that suffer report a family history of migraine. Scientists are not sure if this is genetic or a family predisposition. Despite the uncertainty, a child has a 50% chance of having migraine if one parent suffers. The child has a 75% chance if both parents suffer.  °Q: Can children get headaches? °A: By the time they reach high school, most young people have experienced some type of headache. Many safe and effective approaches or medications can prevent a headache from occurring or stop it after it has begun.  °Q:  What type of doctor should I see to diagnose and treat my headache? °A: Start with your primary caregiver. Discuss his or her experience and approach to headaches. Discuss methods of classification, diagnosis, and treatment. Your caregiver may decide to recommend you to a headache specialist, depending upon your symptoms or other physical conditions. Having diabetes, allergies, etc., may require a more comprehensive and inclusive approach to your headache. The [employer] will provide, upon request, a list of NHF physician members in your state. °Document Released: 10/08/2003 Document Revised: 10/10/2011 Document Reviewed: 03/17/2008 °ExitCare® Patient Information ©2015 [employer]. This information is not intended to replace advice given to you by your health care provider. Make sure you discuss any questions you have with your health care provider. ° °

## 2014-05-20 NOTE — ED Provider Notes (Signed)
CSN: [id number]     Arrival date & time 05/20/14  4098 History   First MD Initiated Contact with Patient 05/20/14 [phone]     Chief Complaint  Patient presents with  . Headache     (Consider location/radiation/quality/duration/timing/severity/associated sxs/prior Treatment) HPI Comments: Patient presents with a headache and elevated blood pressure. She has a history of hypertension and her blood pressures been elevated over the last few weeks. She sees Dr. Constance Goltz. She currently is taking hydrochlorothiazide as well as labetalol. Her labetalol was recently been increased. Her blood pressures have continued to be elevated and the patient was seen this morning in Dr. Lonell Face office. Her blood pressure was noted to be elevated and she was given a prescription to add Hyzaar to her blood pressure management regimen. She also presents with a headache. She states the headache started at 1 AM and she was unable to get back to sleep after this. She has a constant throbbing pain to her temporal area radiating to the back of her head. She states it was previously going on the right side of her neck but denies any neck pain currently. She does have a history of migraines and says this headache feels similar although with her migraines, her pain is normally more in the back of her head as well as her temporal areas. She currently has less pain in the posterior aspect of her head than she normally has with her migraines. With her migraines, she normally does have pain radiating down the right side of her neck. She normally takes Excedrin for migraines which relieves the symptoms. She's not taking anything today for the headache. She says that currently her headache has eased off and is not as severe as it was previously. She denies any ataxia. She denies any dizziness or vision changes. She denies any speech deficits. She denies any numbness or weakness in her extremities. She denies any recent head  injury.  Patient is a 43 y.o. female presenting with headaches.  Headache Associated symptoms: no abdominal pain, no back pain, no congestion, no cough, no diarrhea, no dizziness, no fatigue, no fever, no nausea, no numbness and no vomiting     Past Medical History  Diagnosis Date  . Hypertension   . Migraine   . Abnormal Pap smear of cervix     cryotherapy   Past Surgical History  Procedure Laterality Date  . No past surgeries    . Laparoscopic bilateral salpingectomy Bilateral 03/18/2014    Procedure: LAPAROSCOPIC BILATERAL SALPINGECTOMY;  Surgeon: Allie Bossier, MD;  Location: [facility];  Service: Gynecology;  Laterality: Bilateral;  . Laparoscopic lysis of adhesions Bilateral 03/18/2014    Procedure: LAPAROSCOPIC LYSIS OF ADHESIONS;  Surgeon: Allie Bossier, MD;  Location: [facility];  Service: Gynecology;  Laterality: Bilateral;   Family History  Problem Relation Age of Onset  . Hypertension Mother   . Cancer Maternal Aunt 50    colon Cancer  . Cancer Paternal Aunt 47    brest cancer  . Heart disease Maternal Grandmother   . Stroke Maternal Grandmother   . Stroke Paternal Grandmother    History  Substance Use Topics  . Smoking status: Never Smoker   . Smokeless tobacco: Never Used  . Alcohol Use: No   OB History   Grav Para Term Preterm Abortions TAB SAB Ect Mult Living   4 1 1  2  1   1      Review of Systems  Constitutional: Negative for fever,  chills, diaphoresis and fatigue.  HENT: Negative for congestion, rhinorrhea and sneezing.   Eyes: Negative.   Respiratory: Negative for cough, chest tightness and shortness of breath.   Cardiovascular: Negative for chest pain and leg swelling.  Gastrointestinal: Negative for nausea, vomiting, abdominal pain, diarrhea and blood in stool.  Genitourinary: Negative for frequency, hematuria, flank pain and difficulty urinating.  Musculoskeletal: Negative for arthralgias and back pain.  Skin: Negative for rash.  Neurological: Positive  for headaches. Negative for dizziness, speech difficulty, weakness and numbness.      Allergies  Review of patient's allergies indicates no known allergies.  Home Medications   Prior to Admission medications   Medication Sig Start Date End Date Taking? Authorizing Provider  aspirin-acetaminophen-caffeine (EXCEDRIN MIGRAINE) [phone] MG per tablet Take 1 tablet by mouth every 6 (six) hours as needed for pain.    Historical Provider, MD  Aspirin-Acetaminophen-Caffeine (GOODY HEADACHE PO) Take by mouth.    Historical Provider, MD  hydrochlorothiazide (HYDRODIURIL) 25 MG tablet Take 1 tablet (25 mg total) by mouth daily. 05/15/14   Kendrick Rancheborah D Schoenhoff, MD  ibuprofen (ADVIL,MOTRIN) 600 MG tablet Take 1 tablet (600 mg total) by mouth every 6 (six) hours as needed. 03/18/14   Allie BossierMyra C Dove, MD  labetalol (NORMODYNE) 100 MG tablet Take 1 tablet (100 mg total) by mouth 2 (two) times daily. 05/15/14   Kendrick Rancheborah D Schoenhoff, MD  losartan-hydrochlorothiazide (HYZAAR) 100-25 MG per tablet Take 1 tablet by mouth daily. 05/20/14   Kendrick Rancheborah D Schoenhoff, MD   BP 132/90  Pulse 81  Temp(Src) 98.3 F (36.8 C) (Oral)  Resp 18  Ht 4\' 11"  (1.499 m)  Wt 167 lb (75.751 kg)  BMI 33.71 kg/m2  SpO2 98%  LMP 05/16/2014 Physical Exam  Constitutional: She is oriented to person, place, and time. She appears well-developed and well-nourished.  HENT:  Head: Normocephalic and atraumatic.  Eyes: Pupils are equal, round, and reactive to light.  Neck: Normal range of motion. Neck supple.  Cardiovascular: Normal rate, regular rhythm and normal heart sounds.   Pulmonary/Chest: Effort normal and breath sounds normal. No respiratory distress. She has no wheezes. She has no rales. She exhibits no tenderness.  Abdominal: Soft. Bowel sounds are normal. There is no tenderness. There is no rebound and no guarding.  Musculoskeletal: Normal range of motion. She exhibits no edema.  Lymphadenopathy:    She has no cervical  adenopathy.  Neurological: She is alert and oriented to person, place, and time. She has normal strength. No cranial nerve deficit or sensory deficit. GCS eye subscore is 4. GCS verbal subscore is 5. GCS motor subscore is 6.  FTN intact.  Gait normal, no pronator drift  Skin: Skin is warm and dry. No rash noted.  Psychiatric: She has a normal mood and affect.    ED Course  Procedures (including critical care time) Labs Review Labs Reviewed - No data to display  Imaging Review Ct Head Wo Contrast  05/20/2014   CLINICAL DATA:  Several day history of headache; uncontrolled hypertension.  EXAM: CT HEAD WITHOUT CONTRAST  TECHNIQUE: Contiguous axial images were obtained from the base of the skull through the vertex without intravenous contrast.  COMPARISON:  None.  FINDINGS: The ventricles are normal in size and position. There is no intracranial hemorrhage nor intracranial mass effect. There is no acute ischemic change. There are no abnormal intracranial calcifications. Sign rib The cerebellum and brainstem are normal.  The observed paranasal sinuses and mastoid air cells are clear. There  is no acute skull fracture.  IMPRESSION: There is no acute intracranial abnormality.   Electronically Signed   By: David  [place]   On: 05/20/2014 09:28     EKG Interpretation None      MDM   Final diagnoses:  Nonintractable paroxysmal hemicrania, unspecified chronicity pattern  Essential hypertension    Patient presents with elevated blood pressure and associated headache. Her headaches sound similar to her past migraine is typical headaches although they were a little bit different in character today. By the time she got to the ED, her headache was already improving. She had no other symptoms that would be more suggestive of subarachnoid hemorrhage or meningitis. Her blood pressure was also improving in the ED so I did not give her any further treatment for her hypertension. She was given a prescription  for Hyzaar to add to her blood pressure medications. Instructed her to make an appointment to followup with Dr. she and cough or return here as needed for worsening symptoms. She's had recent blood work as an outpatient so I did not feel that I needed to be repeated today.    Rolan BuccoMelanie Gerrit Rafalski, MD 05/20/14 [phone]

## 2014-05-26 ENCOUNTER — Ambulatory Visit (INDEPENDENT_AMBULATORY_CARE_PROVIDER_SITE_OTHER): Payer: 59 | Admitting: Internal Medicine

## 2014-05-26 ENCOUNTER — Encounter: Payer: Self-pay | Admitting: Internal Medicine

## 2014-05-26 ENCOUNTER — Encounter: Payer: Self-pay | Admitting: *Deleted

## 2014-05-26 VITALS — BP 151/94 | HR 97 | Temp 98.5°F | Resp 16 | Ht 59.0 in | Wt 168.0 lb

## 2014-05-26 DIAGNOSIS — I10 Essential (primary) hypertension: Secondary | ICD-10-CM

## 2014-05-26 DIAGNOSIS — G441 Vascular headache, not elsewhere classified: Secondary | ICD-10-CM

## 2014-05-26 MED ORDER — IBUPROFEN 800 MG PO TABS: ORAL_TABLET | ORAL | Status: DC

## 2014-05-26 MED ORDER — LABETALOL HCL 100 MG PO TABS: ORAL_TABLET | ORAL | Status: DC

## 2014-05-26 NOTE — Progress Notes (Signed)
Subjective:    Patient ID: Terri [name], female    DOB: 08/18/1970, 43 y.o.   MRN: [id number]  HPI  My note 05/2014 With new onset headahce will send to ER for Head Ct Spoke with Dr. Fredderick PhenixBelfi  Continue Labetolol 100 mb 2 bid and will add Hyzaar 100/25 daily (hold HCTZ that pt has )  See me next Monday or sooner prn Will need work up for secondary causes            Today   Terri [name] is here for follow up.   She has been taking her Hyzaar 100/25 once in am and labetolol 100 mg once in am.  Less headaches but she does have migraines that she will use excedrin for.  SBP running 113-117 on this regimen  Migraine.  She tells me she used to work [facility] and was seen by a neurologist  there who told her she had migraines.   She uses Excedrin.  Headache start in temple area and radiates posteriorly.  asssociated with nausea, photophobia and phonophobia.   She has not tried any triptans for this .   She is headache free today    No Known Allergies Past Medical History  Diagnosis Date  . Hypertension   . Migraine   . Abnormal Pap smear of cervix     cryotherapy   Past Surgical History  Procedure Laterality Date  . No past surgeries    . Laparoscopic bilateral salpingectomy Bilateral 03/18/2014    Procedure: LAPAROSCOPIC BILATERAL SALPINGECTOMY;  Surgeon: Allie BossierMyra C Dove, MD;  Location: [facility];  Service: Gynecology;  Laterality: Bilateral;  . Laparoscopic lysis of adhesions Bilateral 03/18/2014    Procedure: LAPAROSCOPIC LYSIS OF ADHESIONS;  Surgeon: Allie BossierMyra C Dove, MD;  Location: [facility];  Service: Gynecology;  Laterality: Bilateral;   History   Social History  . Marital Status: Single    Spouse Name: N/A    Number of Children: N/A  . Years of Education: N/A   Occupational History  . Not on file.   Social History Main Topics  . Smoking status: Never Smoker   . Smokeless tobacco: Never Used  . Alcohol Use: No  . Drug Use: No  . Sexual Activity: Yes    Birth Control/  Protection: Condom   Other Topics Concern  . Not on file   Social History Narrative  . No narrative on file   Family History  Problem Relation Age of Onset  . Hypertension Mother   . Cancer Maternal Aunt 50    colon Cancer  . Cancer Paternal Aunt 8950    brest cancer  . Heart disease Maternal Grandmother   . Stroke Maternal Grandmother   . Stroke Paternal Grandmother    Patient Active Problem List   Diagnosis Date Noted  . Unspecified vitamin D deficiency 10/15/2012  . Essential hypertension, benign 09/24/2012  . History of migraine headaches 09/24/2012  . Overweight 09/24/2012   Current Outpatient Prescriptions on File Prior to Visit  Medication Sig Dispense Refill  . aspirin-acetaminophen-caffeine (EXCEDRIN MIGRAINE) 250-250-65 MG per tablet Take 1 tablet by mouth every 6 (six) hours as needed for pain.      . Aspirin-Acetaminophen-Caffeine (GOODY HEADACHE PO) Take by mouth.      . hydrochlorothiazide (HYDRODIURIL) 25 MG tablet Take 1 tablet (25 mg total) by mouth daily.  30 tablet  1  . ibuprofen (ADVIL,MOTRIN) 600 MG tablet Take 1 tablet (600 mg total) by mouth every 6 (  six) hours as needed.  30 tablet  1  . labetalol (NORMODYNE) 100 MG tablet Take 1 tablet (100 mg total) by mouth 2 (two) times daily.  60 tablet  1  . losartan-hydrochlorothiazide (HYZAAR) 100-25 MG per tablet Take 1 tablet by mouth daily.  30 tablet  1   No current facility-administered medications on file prior to visit.      Review of Systems    see HPI Objective:   Physical Exam Physical Exam  Nursing note and vitals reviewed.  Constitutional: She is oriented to person, place, and time. She appears well-developed and well-nourished.  HENT:  Head: Normocephalic and atraumatic.  Cardiovascular: Normal rate and regular rhythm. Exam reveals no gallop and no friction rub.  No murmur heard.  Pulmonary/Chest: Breath sounds normal. She has no wheezes. She has no rales.  Neurological: She is alert and  oriented to person, place, and time.  CNII-XII intact Refelxes 2+ symmetric Motor 5/5  UE and LE Cerebellar intact  Sensory intact  Skin: Skin is warm and dry.  Psychiatric: She has a normal mood and affect. Her behavior is normal.         Assessment & Plan:  HTN   Will  Continue Hyzaar 100/25 and Labetolol 100 mg in am only  See me in 4 weeks or sooner prn.  Will also initiate work up for secondary causes,  Metanephrines, cortisol,  And get Renal duplex scan   Headache  Has history of migraines but also recent uncontrolled HTN may have also been a culprit.  She does not want to [facility].  [facility] evaluation with [facility]   See me in 4 weeks

## 2014-05-26 NOTE — Patient Instructions (Signed)
Will set up kidney ultrasound test   To pharmacy and lab today   See me in 4 weeks  Take medication as follows  Labetolol 100 mg  One tablet each morning  Hyzaar 100/25 one tablet  each morning

## 2014-05-27 ENCOUNTER — Ambulatory Visit (HOSPITAL_COMMUNITY)
Admission: RE | Admit: 2014-05-27 | Discharge: 2014-05-27 | Disposition: A | Discharge status: Home / Self Care | Payer: 59 | Source: Ambulatory Visit | Attending: Internal Medicine | Admitting: Internal Medicine

## 2014-05-27 DIAGNOSIS — I10 Essential (primary) hypertension: Secondary | ICD-10-CM | POA: Diagnosis not present

## 2014-05-27 LAB — CORTISOL-AM, BLOOD: Cortisol - AM: 14.6 ug/dL (ref 4.3–22.4)

## 2014-05-27 NOTE — Progress Notes (Signed)
*  PRELIMINARY RESULTS* Vascular Ultrasound Renal Artery Duplex has been completed.  Preliminary findings: No evidence of renal artery stenosis.   Farrel DemarkJill Eunice, RDMS, RVT  05/27/2014, 9:31 AM

## 2014-05-28 ENCOUNTER — Encounter: Payer: Self-pay | Admitting: Internal Medicine

## 2014-05-29 LAB — METANEPHRINES, PLASMA
Metanephrine, Free: 63 pg/mL — ABNORMAL HIGH (ref <=57)
Normetanephrine, Free: 231 pg/mL — ABNORMAL HIGH (ref <=148)
TOTAL METANEPHRINES-PLASMA: 294 pg/mL — AB (ref <=205)

## 2014-06-02 ENCOUNTER — Encounter: Payer: Self-pay | Admitting: Internal Medicine

## 2014-06-02 ENCOUNTER — Telehealth: Payer: Self-pay

## 2014-06-02 NOTE — Telephone Encounter (Signed)
Left a voice message for Terri [name] to return my cal..-eh

## 2014-06-02 NOTE — Telephone Encounter (Signed)
Terri [name] is aware that we can not go back and add dates to her FMLA forms. She stated that her headache is improving now-eh

## 2014-06-02 NOTE — Telephone Encounter (Signed)
Terri [name] [phone]  [name] called and she has had a bad headache since last Thursday, Blood pressure seems to fine it was like 134/84. She missed work Thursday, Friday and today, so she is wondering if this can be added to her FMLA paperwork. She also stated that we had missed 05/13/14 on the paperwork we filled out. Could you let her know?

## 2014-06-02 NOTE — Telephone Encounter (Signed)
Paper work already done and faxed   I cannot add dates back in  I first saw her on 10/15 so I cannot put 10/13 on FMLA but she may want to get a written note from the proivder who saw her for that date.

## 2014-06-09 ENCOUNTER — Other Ambulatory Visit: Payer: Self-pay | Admitting: Internal Medicine

## 2014-06-09 ENCOUNTER — Telehealth: Payer: Self-pay | Admitting: Internal Medicine

## 2014-06-09 NOTE — Telephone Encounter (Signed)
Called pt regarding lab results  Left message to return call to office

## 2014-06-10 ENCOUNTER — Telehealth: Payer: Self-pay | Admitting: Internal Medicine

## 2014-06-10 NOTE — Telephone Encounter (Signed)
Left message again for pt to call office regarding blood test results

## 2014-06-16 ENCOUNTER — Telehealth: Payer: Self-pay | Admitting: Internal Medicine

## 2014-06-16 NOTE — Telephone Encounter (Signed)
Attempted to call pts listed contact but voicemail stated that he was not accepting calls

## 2014-06-16 NOTE — Telephone Encounter (Signed)
Left message for pt.  To call office regarding lab results

## 2014-06-23 ENCOUNTER — Ambulatory Visit: Payer: 59 | Admitting: Internal Medicine

## 2014-06-24 ENCOUNTER — Encounter: Payer: Self-pay | Admitting: Internal Medicine

## 2014-06-25 ENCOUNTER — Telehealth: Payer: Self-pay

## 2014-06-25 NOTE — Telephone Encounter (Signed)
Sent certified letter

## 2014-06-25 NOTE — Telephone Encounter (Signed)
Mailed certified letter.

## 2014-07-01 ENCOUNTER — Telehealth: Payer: Self-pay | Admitting: Internal Medicine

## 2014-07-01 ENCOUNTER — Telehealth: Payer: Self-pay | Admitting: *Deleted

## 2014-07-01 DIAGNOSIS — R825 Elevated urine levels of drugs, medicaments and biological substances: Secondary | ICD-10-CM

## 2014-07-01 NOTE — Telephone Encounter (Signed)
Spoke with pt . And discussed all lab results  Bp at home 120's -130's but still with occasional headahces  Given elevated metanephrines will get CT and pt to see me in office after testing done

## 2014-07-01 NOTE — Telephone Encounter (Signed)
I left a message with Terri [name] about her Ct scan appointment-eh

## 2014-07-02 ENCOUNTER — Encounter: Payer: No Typology Code available for payment source | Admitting: Internal Medicine

## 2014-07-05 ENCOUNTER — Ambulatory Visit (HOSPITAL_BASED_OUTPATIENT_CLINIC_OR_DEPARTMENT_OTHER)
Admission: RE | Admit: 2014-07-05 | Discharge: 2014-07-05 | Disposition: A | Discharge status: Home / Self Care | Payer: 59 | Source: Ambulatory Visit | Attending: Internal Medicine | Admitting: Internal Medicine

## 2014-07-05 ENCOUNTER — Encounter (HOSPITAL_BASED_OUTPATIENT_CLINIC_OR_DEPARTMENT_OTHER): Payer: Self-pay

## 2014-07-05 DIAGNOSIS — E279 Disorder of adrenal gland, unspecified: Secondary | ICD-10-CM | POA: Insufficient documentation

## 2014-07-05 DIAGNOSIS — I10 Essential (primary) hypertension: Secondary | ICD-10-CM | POA: Insufficient documentation

## 2014-07-05 DIAGNOSIS — R825 Elevated urine levels of drugs, medicaments and biological substances: Secondary | ICD-10-CM

## 2014-07-05 DIAGNOSIS — N888 Other specified noninflammatory disorders of cervix uteri: Secondary | ICD-10-CM | POA: Insufficient documentation

## 2014-07-05 MED ORDER — IOHEXOL 300 MG/ML  SOLN
100.0000 mL | Freq: Once | INTRAMUSCULAR | Status: AC | PRN
  Administered 2014-07-05: 100 mL via INTRAVENOUS

## 2014-07-07 ENCOUNTER — Telehealth: Payer: Self-pay | Admitting: Neurology

## 2014-07-07 ENCOUNTER — Telehealth: Payer: Self-pay | Admitting: Internal Medicine

## 2014-07-07 DIAGNOSIS — R935 Abnormal findings on diagnostic imaging of other abdominal regions, including retroperitoneum: Secondary | ICD-10-CM

## 2014-07-07 NOTE — Telephone Encounter (Signed)
Pt called to cancel her NP appt 07/11/14. She did not give a reason.  dr schoenhoffe/ referring provider was notified.

## 2014-07-07 NOTE — Telephone Encounter (Signed)
Spoke with pt.  And informed of CT results  Unclear lesion upper vagina  Will refer to Dr. Marice Potterove pt voices understanding

## 2014-07-07 NOTE — Telephone Encounter (Signed)
Terri [name] is aware that she has an appointment with Dr. Marice Potterove on 07/21/14-eh

## 2014-07-08 ENCOUNTER — Telehealth: Payer: Self-pay

## 2014-07-08 NOTE — Telephone Encounter (Signed)
Terri [name] Welcome [phone]  [name] called and wanted to see if we could mail her CT results to her instead of her coming by to pick them up.

## 2014-07-08 NOTE — Telephone Encounter (Signed)
Mailed results-eh

## 2014-07-11 ENCOUNTER — Ambulatory Visit: Payer: 59 | Admitting: Neurology

## 2014-07-21 ENCOUNTER — Encounter: Payer: Self-pay | Admitting: Obstetrics & Gynecology

## 2014-08-03 ENCOUNTER — Encounter: Payer: Self-pay | Admitting: Internal Medicine

## 2014-08-03 DIAGNOSIS — R935 Abnormal findings on diagnostic imaging of other abdominal regions, including retroperitoneum: Secondary | ICD-10-CM | POA: Insufficient documentation

## 2014-10-28 ENCOUNTER — Other Ambulatory Visit: Payer: Self-pay | Admitting: Internal Medicine

## 2014-10-28 NOTE — Telephone Encounter (Signed)
Refill request - I do not see this medication on her current medication list ( Metoprolol)

## 2016-01-20 ENCOUNTER — Encounter: Payer: Self-pay | Admitting: Behavioral Health

## 2016-01-20 ENCOUNTER — Telehealth: Payer: Self-pay | Admitting: Behavioral Health

## 2016-01-20 NOTE — Telephone Encounter (Signed)
Pre-Visit Call completed with patient and chart updated.   Pre-Visit Info documented in Specialty Comments under SnapShot.    

## 2016-01-20 NOTE — Addendum Note (Signed)
Addended by: Melanee SpryBYRD, RONECIA E on: 01/20/2016 04:49 PM   Modules accepted: Orders, Medications

## 2016-01-20 NOTE — Telephone Encounter (Signed)
Unable to reach patient at time of Pre-Visit Call.  Left message for patient to return call when available.    

## 2016-01-21 ENCOUNTER — Ambulatory Visit (INDEPENDENT_AMBULATORY_CARE_PROVIDER_SITE_OTHER): Payer: Self-pay | Admitting: Family Medicine

## 2016-01-21 ENCOUNTER — Encounter: Payer: Self-pay | Admitting: Family Medicine

## 2016-01-21 VITALS — BP 158/110 | HR 85 | Temp 98.2°F | Ht 60.0 in | Wt 173.8 lb

## 2016-01-21 DIAGNOSIS — I10 Essential (primary) hypertension: Secondary | ICD-10-CM | POA: Diagnosis not present

## 2016-01-21 MED ORDER — METOPROLOL TARTRATE 25 MG PO TABS: 25.0000 mg | ORAL_TABLET | Freq: Two times a day (BID) | ORAL | Status: DC

## 2016-01-21 NOTE — Patient Instructions (Signed)
Start on metoprolol twice a day.  After about one week check your BP at work over a few days.  If your numbers are still higher than 150/90 increase to 2 pills twice a day.    Please come and see me for a physical in 1-2 months.

## 2016-01-21 NOTE — Progress Notes (Signed)
[facility] [place] [address], [address] [place], [place] [address] [phone] [phone]  Date:  01/21/2016   Name:  Terri [name]   DOB:  03/14/1971   MRN:  [id number]  PCP:  Abbe AmsterdamOPLAND,JESSICA, MD    Chief Complaint: Establish Care   History of Present Illness:  Terri [name] is a 45 y.o. very pleasant female patient who presents with the following:  She is here today as a new patient- she was on metoprolol for her BP She was first dx with HTN 2 or 3 years ago; she may have had HTN prior to that Her sx did not start during pregnancy.   She noticed that she was having frequent headaches and this led to dx of HTN.  There is a strong family history of HTN  She was on metoprolol xl and lopressor in the past and liked the BID dosing better She tried a couple of other BP medications first but had fever SE with the metoprolol   She had sterilization surgery- tubes were removed  She will check her BP on occasion- may get 180/ 100- 110 when she checks at the drug store BP Readings from Last 3 Encounters:  01/21/16 158/110  05/26/14 151/94  05/20/14 132/90     Patient Active Problem List   Diagnosis Date Noted  . Abnormal abdominal CT scan  see 2015 scan  referral to GYN made 08/03/2014  . Unspecified vitamin D deficiency 10/15/2012  . Essential hypertension, benign 09/24/2012  . History of migraine headaches 09/24/2012  . Overweight(278.02) 09/24/2012    Past Medical History  Diagnosis Date  . Hypertension   . Migraine   . Abnormal Pap smear of cervix     cryotherapy    Past Surgical History  Procedure Laterality Date  . No past surgeries    . Laparoscopic bilateral salpingectomy Bilateral 03/18/2014    Procedure: LAPAROSCOPIC BILATERAL SALPINGECTOMY;  Surgeon: Allie BossierMyra C Dove, MD;  Location: [facility];  Service: Gynecology;  Laterality: Bilateral;  . Laparoscopic lysis of adhesions Bilateral 03/18/2014    Procedure: LAPAROSCOPIC  LYSIS OF ADHESIONS;  Surgeon: Allie BossierMyra C Dove, MD;  Location: [facility];  Service: Gynecology;  Laterality: Bilateral;    Social History  Substance Use Topics  . Smoking status: Never Smoker   . Smokeless tobacco: Never Used  . Alcohol Use: No    Family History  Problem Relation Age of Onset  . Hypertension Mother   . Cancer Maternal Aunt 50    colon Cancer  . Cancer Paternal Aunt 650    brest cancer  . Heart disease Maternal Grandmother   . Stroke Maternal Grandmother   . Stroke Paternal Grandmother     No Known Allergies  Medication list has been reviewed and updated.  Current Outpatient Prescriptions on File Prior to Visit  Medication Sig Dispense Refill  . aspirin-acetaminophen-caffeine (EXCEDRIN MIGRAINE) 250-250-65 MG per tablet Take 1 tablet by mouth every 6 (six) hours as needed for pain.    . Aspirin-Acetaminophen-Caffeine (GOODY HEADACHE PO) Take by mouth.    . metoprolol succinate (TOPROL-XL) 50 MG 24 hr tablet TAKE ONE TABLET BY MOUTH ONCE DAILY TAKE  WITH  OR  IMMEDIATELY  FOLLOW  A  MEAL (Patient not taking: Reported on 01/20/2016) 90 tablet 1   No current facility-administered medications on file prior to visit.    Review of Systems:  As per HPI- otherwise negative.   Physical Examination: Filed Vitals:  01/21/16 1632  BP: 158/110  Pulse: 85  Temp: 98.2 F (36.8 C)   Filed Vitals:   01/21/16 1632  Height: 5' (1.524 m)  Weight: 173 lb 12.8 oz (78.835 kg)   Body mass index is 33.94 kg/(m^2). Ideal Body Weight: Weight in (lb) to have BMI = 25: 127.7  GEN: WDWN, NAD, Non-toxic, A & O x 3, obese, looks well HEENT: Atraumatic, Normocephalic. Neck supple. No masses, No LAD. Ears and Nose: No external deformity. CV: RRR, No M/G/R. No JVD. No thrill. No extra heart sounds. PULM: CTA B, no wheezes, crackles, rhonchi. No retractions. No resp. distress. No accessory muscle use. ABD: S, NT, ND. No rebound. No HSM. EXTR: No c/c/e NEURO Normal gait.  PSYCH:  Normally interactive. Conversant. Not depressed or anxious appearing.  Calm demeanor.    Assessment and Plan: Essential hypertension - Plan: metoprolol tartrate (LOPRESSOR) 25 MG tablet  Uncontrolled HTN- start on lopressor 25 BID.  She will watch her numbers and increase to 50 BID if needed Plan for a visit here in 1-2 months for CPE See patient instructions for more details.     Signed Abbe AmsterdamJessica Copland, MD

## 2016-01-21 NOTE — Progress Notes (Signed)
Pre visit review using our clinic review tool, if applicable. No additional management support is needed unless otherwise documented below in the visit note. 

## 2016-03-02 ENCOUNTER — Encounter: Payer: 59 | Admitting: Family Medicine

## 2016-03-21 ENCOUNTER — Ambulatory Visit (INDEPENDENT_AMBULATORY_CARE_PROVIDER_SITE_OTHER): Payer: 59 | Admitting: Family Medicine

## 2016-03-21 ENCOUNTER — Encounter: Payer: Self-pay | Admitting: Family Medicine

## 2016-03-21 ENCOUNTER — Other Ambulatory Visit (HOSPITAL_COMMUNITY)
Admission: RE | Admit: 2016-03-21 | Discharge: 2016-03-21 | Disposition: A | Discharge status: Home / Self Care | Payer: 59 | Source: Ambulatory Visit | Attending: Family Medicine | Admitting: Family Medicine

## 2016-03-21 VITALS — BP 138/100 | HR 73 | Temp 97.8°F | Ht 60.0 in | Wt 172.0 lb

## 2016-03-21 DIAGNOSIS — Z1151 Encounter for screening for human papillomavirus (HPV): Secondary | ICD-10-CM | POA: Insufficient documentation

## 2016-03-21 DIAGNOSIS — Z1329 Encounter for screening for other suspected endocrine disorder: Secondary | ICD-10-CM | POA: Diagnosis not present

## 2016-03-21 DIAGNOSIS — R053 Chronic cough: Secondary | ICD-10-CM

## 2016-03-21 DIAGNOSIS — Z0000 Encounter for general adult medical examination without abnormal findings: Secondary | ICD-10-CM

## 2016-03-21 DIAGNOSIS — Z131 Encounter for screening for diabetes mellitus: Secondary | ICD-10-CM

## 2016-03-21 DIAGNOSIS — I10 Essential (primary) hypertension: Secondary | ICD-10-CM

## 2016-03-21 DIAGNOSIS — Z8639 Personal history of other endocrine, nutritional and metabolic disease: Secondary | ICD-10-CM

## 2016-03-21 DIAGNOSIS — Z13220 Encounter for screening for lipoid disorders: Secondary | ICD-10-CM

## 2016-03-21 DIAGNOSIS — Z01411 Encounter for gynecological examination (general) (routine) with abnormal findings: Secondary | ICD-10-CM | POA: Diagnosis present

## 2016-03-21 DIAGNOSIS — Z124 Encounter for screening for malignant neoplasm of cervix: Secondary | ICD-10-CM

## 2016-03-21 DIAGNOSIS — Z130 Encounter for screening for diseases of the blood and blood-forming organs and certain disorders involving the immune mechanism: Secondary | ICD-10-CM | POA: Diagnosis not present

## 2016-03-21 DIAGNOSIS — R05 Cough: Secondary | ICD-10-CM

## 2016-03-21 LAB — LIPID PANEL
CHOL/HDL RATIO: 3
Cholesterol: 130 mg/dL (ref 0–200)
HDL: 43.9 mg/dL (ref >39.00)
LDL CALC: 76 mg/dL (ref 0–99)
NONHDL: 86.28
Triglycerides: 51 mg/dL (ref 0.0–149.0)
VLDL: 10.2 mg/dL (ref 0.0–40.0)

## 2016-03-21 LAB — COMPREHENSIVE METABOLIC PANEL
ALT: 15 U/L (ref 0–35)
AST: 20 U/L (ref 0–37)
Albumin: 3.8 g/dL (ref 3.5–5.2)
Alkaline Phosphatase: 58 U/L (ref 39–117)
BUN: 12 mg/dL (ref 6–23)
CHLORIDE: 105 meq/L (ref 96–112)
CO2: 25 meq/L (ref 19–32)
Calcium: 8.7 mg/dL (ref 8.4–10.5)
Creatinine, Ser: 0.99 mg/dL (ref 0.40–1.20)
GFR: 77.88 mL/min (ref >60.00)
GLUCOSE: 84 mg/dL (ref 70–99)
Potassium: 3.5 mEq/L (ref 3.5–5.1)
SODIUM: 137 meq/L (ref 135–145)
Total Bilirubin: 0.4 mg/dL (ref 0.2–1.2)
Total Protein: 6.7 g/dL (ref 6.0–8.3)

## 2016-03-21 LAB — CBC
HCT: 37.2 % (ref 36.0–46.0)
Hemoglobin: 12.1 g/dL (ref 12.0–15.0)
MCHC: 32.5 g/dL (ref 30.0–36.0)
MCV: 84.3 fl (ref 78.0–100.0)
Platelets: 376 10*3/uL (ref 150.0–400.0)
RBC: 4.41 Mil/uL (ref 3.87–5.11)
RDW: 17.7 % — ABNORMAL HIGH (ref 11.5–15.5)
WBC: 7.7 10*3/uL (ref 4.0–10.5)

## 2016-03-21 LAB — HEMOGLOBIN A1C: Hgb A1c MFr Bld: 5.7 % (ref 4.6–6.5)

## 2016-03-21 LAB — TSH: TSH: 1.05 u[IU]/mL (ref 0.35–4.50)

## 2016-03-21 LAB — VITAMIN D 25 HYDROXY (VIT D DEFICIENCY, FRACTURES): VITD: 19.37 ng/mL — AB (ref 30.00–100.00)

## 2016-03-21 MED ORDER — LISINOPRIL 5 MG PO TABS: 5.0000 mg | ORAL_TABLET | Freq: Every day | ORAL | 3 refills | Status: DC

## 2016-03-21 NOTE — Progress Notes (Signed)
[facility] [place] [address], [address] [place], [place] [address] [phone] [phone]  Date:  03/21/2016   Name:  Terri Desanctisonya Y Spargur   DOB:  09/02/1970   MRN:  [id number]  PCP:  Abbe AmsterdamOPLAND,Andres Escandon, MD    Chief Complaint: Annual Exam (Last PAP and mammogram 2 yrs ago. Pt is fasting for lab work. Pt has bp log. )   History of Present Illness:  Terri [name] is a 45 y.o. very pleasant female patient who presents with the following:  Here today for a CPE- seen about 2 months ago to establish care and discuss her HTN- at that time we started her on lopressor 25 BID .   She brings with her a BP log.   For the last copule of days she increased to 50 mg BID.   No SE of the medicatoin such as feeling lightheaded.    She is fasting today for labs  Last pap a couple of years ago. She never had an abnl pap.  She is s/p BTL Back in 2015 she had an abnl CT abd/ pelvis:  IMPRESSION: 1. Bilateral adrenal glands are normal in appearance. 2. Solitary renal arteries bilaterally, which are widely patent. 3. No alternative explanation for the patient's poorly controlled hypertension. 4. Unusual appearance of the lower uterine segment and cervical region. This is favored to represents normal variant enhancement (likely slightly earlier phase than typically obtained), in association with multiple nabothian cysts. However, there is also slight irregularity of the upper anterior wall of the vagina. Correlation with physical examination and direct inspection is recommended to exclude underlying malignancy.  Pt reports that she saw Dr. Marice Potterove and had evaluation. I will follow-up with Dr. Marice Potterove to ensure that all is taken care of  She has noted an intermittent cough for about 2 years.  She does notice that the cough will be worse if she is exposed to a bleach based cleaner. No change with inside or outside.  Maybe a little worse with hot weather.   Cough is dry,  no fever.  No hemoptysis, no unintentioanl weight loss  LMP 03/10/16  Her menses are still regular.   She does have a history of migraine- when she has the cough it can be worse She has noted some runny nose and sneezing- she does not take anything for allergies.     Patient Active Problem List   Diagnosis Date Noted  . Abnormal abdominal CT scan  see 2015 scan  referral to GYN made 08/03/2014  . Unspecified vitamin D deficiency 10/15/2012  . Essential hypertension, benign 09/24/2012  . History of migraine headaches 09/24/2012  . Overweight(278.02) 09/24/2012    Past Medical History:  Diagnosis Date  . Abnormal Pap smear of cervix    cryotherapy  . Hypertension   . Migraine     Past Surgical History:  Procedure Laterality Date  . LAPAROSCOPIC BILATERAL SALPINGECTOMY Bilateral 03/18/2014   Procedure: LAPAROSCOPIC BILATERAL SALPINGECTOMY;  Surgeon: Allie BossierMyra C Dove, MD;  Location: [facility];  Service: Gynecology;  Laterality: Bilateral;  . LAPAROSCOPIC LYSIS OF ADHESIONS Bilateral 03/18/2014   Procedure: LAPAROSCOPIC LYSIS OF ADHESIONS;  Surgeon: Allie BossierMyra C Dove, MD;  Location: [facility];  Service: Gynecology;  Laterality: Bilateral;  . NO PAST SURGERIES      Social History  Substance Use Topics  . Smoking status: Never Smoker  . Smokeless tobacco: Never Used  . Alcohol use No    Family History  Problem Relation Age of Onset  . Hypertension Mother   . Cancer Maternal Aunt 50    colon Cancer  . Cancer Paternal Aunt 3450    brest cancer  . Heart disease Maternal Grandmother   . Stroke Maternal Grandmother   . Stroke Paternal Grandmother     No Known Allergies  Medication list has been reviewed and updated.  Current Outpatient Prescriptions on File Prior to Visit  Medication Sig Dispense Refill  . aspirin-acetaminophen-caffeine (EXCEDRIN MIGRAINE) 250-250-65 MG per tablet Take 1 tablet by mouth every 6 (six) hours as needed for pain.    . Aspirin-Acetaminophen-Caffeine (GOODY  HEADACHE PO) Take by mouth.    . metoprolol tartrate (LOPRESSOR) 25 MG tablet Take 1 tablet (25 mg total) by mouth 2 (two) times daily. 180 tablet 3   No current facility-administered medications on file prior to visit.     Review of Systems:  As per HPI- otherwise negative.   Physical Examination: Vitals:   03/21/16 0834 03/21/16 0843  BP: (!) 142/100 (!) 138/100  Pulse: 73   Temp: 97.8 F (36.6 C)    Vitals:   03/21/16 0834  Weight: 172 lb (78 kg)  Height: 5' (1.524 m)   Body mass index is 33.59 kg/m. Ideal Body Weight: Weight in (lb) to have BMI = 25: 127.7  GEN: WDWN, NAD, Non-toxic, A & O x 3, looks well, mild overweight HEENT: Atraumatic, Normocephalic. Neck supple. No masses, No LAD.  Bilateral TM wnl, oropharynx normal.  PEERL,EOMI.   Ears and Nose: No external deformity. CV: RRR, No M/G/R. No JVD. No thrill. No extra heart sounds. PULM: CTA B, no wheezes, crackles, rhonchi. No retractions. No resp. distress. No accessory muscle use. ABD: S, NT, ND. No rebound. No HSM. EXTR: No c/c/e NEURO Normal gait.  PSYCH: Normally interactive. Conversant. Not depressed or anxious appearing.  Calm demeanor.  Breast: normal exam, no masses/ dimpling/ discharge Pelvic: normal, no vaginal lesions or discharge. Uterus normal, no CMT, no adnexal tendereness or masses   Assessment and Plan: Physical exam  Screening for hyperlipidemia - Plan: Lipid panel  Screening for diabetes mellitus - Plan: Hemoglobin A1c  Screening for thyroid disorder - Plan: TSH  History of vitamin D deficiency - Plan: Vitamin D (25 hydroxy)  Essential hypertension, benign - Plan: Comprehensive metabolic panel, lisinopril (PRINIVIL,ZESTRIL) 5 MG tablet  Screening for cervical cancer - Plan: Cytology - PAP  Screening for deficiency anemia - Plan: CBC  Persistent cough  Screening and labs as above.  We do not have flu shots in yet.  Await her labs and I will be in touch with her.   Decided to add  5mg  of lisinopril to her BP regimen as her pressure is still too high but her pulse if borderline low.   Sent pt a mychart about her cough: Hi Ariahna-  I remembered that we did not finish making a plan as far as your cough!  The best first step would be to try treating you for allergies, a common cause of cough.  Start with an OTC zyrtec/ claritin and you might also use a flonase nasal spray available OTC.  Let me know if this is not helpful for you!   Will plan further follow- up pending labs.   Signed Abbe AmsterdamJessica Lumir Demetriou, MD

## 2016-03-21 NOTE — Progress Notes (Signed)
Pre visit review using our clinic review tool, if applicable. No additional management support is needed unless otherwise documented below in the visit note. 

## 2016-03-21 NOTE — Patient Instructions (Signed)
It was great to see you today- I'll be in touch with your labs asap We will check your thyroid, blood sugar, cholesterol, vitamin D today We will also check your pap Please add 5 mg of lisinopril to your BP regimen- let me know if your BP is getting lower than 115/75, or if you are NOT getting to 130/85

## 2016-03-22 ENCOUNTER — Telehealth: Payer: Self-pay | Admitting: Family Medicine

## 2016-03-22 DIAGNOSIS — I10 Essential (primary) hypertension: Secondary | ICD-10-CM

## 2016-03-22 LAB — CYTOLOGY - PAP

## 2016-03-22 MED ORDER — METOPROLOL TARTRATE 50 MG PO TABS: 50.0000 mg | ORAL_TABLET | Freq: Two times a day (BID) | ORAL | 3 refills | Status: DC

## 2016-03-22 NOTE — Telephone Encounter (Signed)
°  Relationship to patient: Self  Can be reached: [phone]  Pharmacy:  [id number] [place], [place] [id number] [phone] (Phone) [phone] (Fax)   Reason for call: Request increase on Metoprolol to 50 mgs two times per day. States this is what she has taken for about a week and it seems to be working. If this is possible, please send Rx for new dose to pharmacy

## 2016-05-18 ENCOUNTER — Telehealth: Payer: Self-pay | Admitting: Family Medicine

## 2016-05-18 NOTE — Telephone Encounter (Signed)
Pt called in to schedule an appt. Pt complained about some chest discomfort. Pt says that it feels like she has gas but she's been taking TUMS and they are not relieving her. Transferred pt to team health for triaging.

## 2016-05-18 NOTE — Telephone Encounter (Signed)
[facility] Night - Client  TELEPHONE ADVICE RECORD   [facility]    --------------------------------------------------------------------------------   Patient Name: Terri [name]  Gender: Female  DOB: 01/28/1971   Age: [age] Y 6 M 12 D  Return Phone Number: [phone] (Primary)  Address:     City/State/Zip:  [place]     [facility] - Client  Client Site [facility] - Night  Physician Warner Mccreedyopeland, Jessica   Contact Type Call  Who Is Calling Patient / Member / Family / Caregiver  Call Type Triage / Clinical  Relationship To Patient Self  Return Phone Number [phone] (Primary)  Chief Complaint CHEST PAIN (>=21 years) - pain, pressure, heaviness or tightness  Reason for Call Symptomatic / Request for Health Information  Initial Comment Caller states that she is having chest pain, no respiratrory distress  GOTO Facility Not Listed Caller will go to the ED   PreDisposition Did not know what to do  Translation No       Nurse Assessment  Nurse: Harlon FlorWhitaker, RN, Darl PikesSusan Date/Time (Eastern Time): 05/18/2016 4:38:08 PM  Confirm and document reason for call. If symptomatic, describe symptoms. You must click the next button to save text entered. ---Caller states that she is having chest pain, no respiratory distress The pain is between the breasts for a week. now no other symptoms. She has heavy feeling in her chest.    Has the patient traveled out of the country within the last 30 days? ---No    Does the patient have any new or worsening symptoms? ---Yes    Will a triage be completed? ---Yes    Related visit to physician within the last 2 weeks? ---No    Does the PT have any chronic conditions? (i.e. diabetes, asthma, etc.) ---Yes    List chronic conditions. ---HTN LMP Oct 5th    Is the patient pregnant or possibly pregnant? (Ask all females between the ages of 1112-55) ---No    Is this a  behavioral health or substance abuse call? ---No           Guidelines          Guideline Title Affirmed Question Affirmed Notes Nurse Date/Time Lamount Cohen(Eastern Time)  Chest Pain [1] Intermittent chest pain or "angina" AND [2] increasing in severity or frequency (Exception: pains lasting a few seconds)    Whitaker, RN, Darl PikesSusan 05/18/2016 4:41:13 PM    Disp. Time Lamount Cohen(Eastern Time) Disposition Final User    05/18/2016 4:36:32 PM Send to Urgent Queue   Thurnell GarbeCostley, Robert      05/18/2016 4:43:30 PM Go to ED Now Yes Harlon FlorWhitaker, RN, Helane RimaSusan            Caller Understands: Yes  Disagree/Comply: Comply       Care Advice Given Per Guideline        GO TO ED NOW: You need to be seen in the Emergency Department. Go to the ER at ___________ Hospital. Leave now. Drive carefully. DRIVING: Another adult should drive. NOTHING BY MOUTH: Do not eat or drink anything for now. CALL EMS IF: * Severe difficulty breathing occurs * Passes out or becomes too weak to stand * You become worse. CARE ADVICE given per Chest Pain (Adult) guideline.        --------------------------------------------------------------------------------            Referrals  [facility] - ED

## 2016-05-19 ENCOUNTER — Encounter: Payer: Self-pay | Admitting: Family Medicine

## 2016-05-19 ENCOUNTER — Ambulatory Visit (INDEPENDENT_AMBULATORY_CARE_PROVIDER_SITE_OTHER): Payer: 59 | Admitting: Family Medicine

## 2016-05-19 VITALS — BP 144/102 | HR 80 | Temp 98.2°F | Ht 60.0 in | Wt 171.4 lb

## 2016-05-19 DIAGNOSIS — I10 Essential (primary) hypertension: Secondary | ICD-10-CM

## 2016-05-19 DIAGNOSIS — R079 Chest pain, unspecified: Secondary | ICD-10-CM

## 2016-05-19 MED ORDER — LISINOPRIL 20 MG PO TABS: 20.0000 mg | ORAL_TABLET | Freq: Every day | ORAL | 3 refills | Status: DC

## 2016-05-19 NOTE — Telephone Encounter (Signed)
Called pt to follow up. She did not go to ED as advised. She was at work at time of call and stated she was thinking she would go to ED in the next few days after work. States she is not having chest pain, but rather a 'funny feeling' in her chest intermittently x1 week. The sensation is not present at time of call. Scheduled pt for acute appt w/ Dr. Carmelia RollerWendling today at 4:15 (pt declined sooner appt due to work) and instructed her to go to ED immediately if any recurrence of symptoms or severe/crushing chest pain and she agreed to plan.

## 2016-05-19 NOTE — Patient Instructions (Signed)
Ranitidine (Zantac) or omeprazole (Prilosec) could be more helpful than Tums for your symptoms. Try to elevated the head of the bed and avoid aggravating foods as well.

## 2016-05-19 NOTE — Progress Notes (Signed)
Chief Complaint  Patient presents with  . Chest Pain    Pt reports feeling chest pressure more intense at night and also belching alot     Subjective: Patient is a 45 y.o. female here for chest pain.  Central pain that radiates to upper abdomen has been bothering her for the past week. She has been belching a lot and notes it to be a pressure. It lasts for around 2 hours when she gets it, worse at night. No association with meals or physical exertion. She has it mildly now. No hx of injury or change in activity, denies SOB, jaw pain, arm pain, N/V, numbness or tingling.  ROS: Heart: +chest pain Lungs: Denies SOB or cough  Family History  Problem Relation Age of Onset  . Hypertension Mother   . Cancer Maternal Aunt 50    colon Cancer  . Cancer Paternal Aunt 2950    brest cancer  . Heart disease Maternal Grandmother   . Stroke Maternal Grandmother   . Stroke Paternal Grandmother    Past Medical History:  Diagnosis Date  . Abnormal Pap smear of cervix    cryotherapy  . Hypertension   . Migraine    No Known Allergies  Current Outpatient Prescriptions:  .  aspirin-acetaminophen-caffeine (EXCEDRIN MIGRAINE) 250-250-65 MG per tablet, Take 1 tablet by mouth every 6 (six) hours as needed for pain., Disp: , Rfl:  .  Aspirin-Acetaminophen-Caffeine (GOODY HEADACHE PO), Take by mouth., Disp: , Rfl:  .  metoprolol tartrate (LOPRESSOR) 50 MG tablet, Take 1 tablet (50 mg total) by mouth 2 (two) times daily., Disp: 180 tablet, Rfl: 3 .  lisinopril (PRINIVIL,ZESTRIL) 20 MG tablet, Take 1 tablet (20 mg total) by mouth daily., Disp: 30 tablet, Rfl: 3  Objective: BP (!) 144/102 (BP Location: Left Arm, Patient Position: Sitting, Cuff Size: Small)   Pulse 80   Temp 98.2 F (36.8 C) (Oral)   Ht 5' (1.524 m)   Wt 171 lb 6.4 oz (77.7 kg)   LMP 05/09/2016   SpO2 98%   BMI 33.47 kg/m  General: Awake, appears stated age HEENT: MMM, no pharyngeal erythema or exudate Heart: RRR, no murmurs, no  bruits, no LE edema Lungs: CTAB, no rales, wheezes or rhonchi. Normal effort MSK: No TTP over the sternum Abd: BS+, soft, NT, ND, no masses or organomegaly Psych: Age appropriate judgment and insight, normal affect and mood  Assessment and Plan: Chest pain, unspecified type - Plan: EKG 12-Lead  Essential hypertension, benign - Plan: lisinopril (PRINIVIL,ZESTRIL) 20 MG tablet  Orders as above.  EKG unremarkable. GI cocktail not fully effective, GERD most likely given hx, PE, and EKG. Gave recs for H2 vs PPI. Will recheck BP in 2-3 weeks with Dr. Patsy Lageropland. May be reasonable to recheck electrolytes with increased dose of Lisinopril (5 to 20 mg daily).  The patient voiced understanding and agreement to the plan.  Jilda Rocheicholas Paul SenathWendling, DO 05/19/16  5:00 PM

## 2016-05-19 NOTE — Progress Notes (Signed)
Pre visit review using our clinic review tool, if applicable. No additional management support is needed unless otherwise documented below in the visit note. 

## 2016-08-08 ENCOUNTER — Ambulatory Visit: Payer: 59 | Admitting: Family Medicine

## 2016-08-29 ENCOUNTER — Ambulatory Visit: Payer: 59 | Admitting: Family Medicine

## 2016-09-08 ENCOUNTER — Ambulatory Visit (INDEPENDENT_AMBULATORY_CARE_PROVIDER_SITE_OTHER): Payer: 59 | Admitting: Family Medicine

## 2016-09-08 VITALS — BP 150/100 | HR 77 | Temp 98.3°F | Ht 60.0 in | Wt 164.8 lb

## 2016-09-08 DIAGNOSIS — K219 Gastro-esophageal reflux disease without esophagitis: Secondary | ICD-10-CM | POA: Diagnosis not present

## 2016-09-08 DIAGNOSIS — I10 Essential (primary) hypertension: Secondary | ICD-10-CM

## 2016-09-08 DIAGNOSIS — R1013 Epigastric pain: Secondary | ICD-10-CM

## 2016-09-08 MED ORDER — LISINOPRIL 40 MG PO TABS: 40.0000 mg | ORAL_TABLET | Freq: Every day | ORAL | 3 refills | Status: DC

## 2016-09-08 NOTE — Patient Instructions (Signed)
It was good to see you today- we will do some blood work and also do a breath test for H pylori bacteria today.   If you do have H pylori, we will treat this and hopefully your stomach issues will go away.  If you are negative, we will have you see GI We will also increase your lisinopril to 40 mg once a day- continue the metoprolol also Please check your BP a few times over the next couple of weeks and let me know how it looks over [employer]

## 2016-09-08 NOTE — Progress Notes (Addendum)
[facility] [address], [address] [place], [place] [address] [phone] [phone]  Date:  09/08/2016   Name:  Terri [name]   DOB:  07/07/1971   MRN:  [id number]  PCP:  Abbe AmsterdamOPLAND,Nolin Grell, MD    Chief Complaint: Follow-up (Pt here for f/u visit. Pt was seen 05/19/2016 and was told to take Zantac for chest discomfort. Pt states that this does help but she has to take it every night. )   History of Present Illness:  Terri [name] is a 46 y.o. very pleasant female patient who presents with the following:  Seen by myself in August for a CPE- at that time we added 5 mg of lisinopril to her BP regimen.  She was seen on 10/19 with chest pain- was started on zantac  Here today to follow-up on her HTN She notes that she is still taking zantac most nights - this does help She mostly gets the heartburn after she lays down at night She has not suffered from this issue in the past- just started over the last year She does have some burping but this is better No vomiting or diarrhea   Her BP at the eye doctor a couple of weeks ago was high She is still taking lisinopril and metoprolol BID She has not noted any headache  She has also noted a persistent cough for the last 3 years  She takes occasional- not chronic- NSAIDs.  No goody powder or BC powder use  BP Readings from Last 3 Encounters:  09/08/16 (!) 168/101  05/19/16 (!) 144/102  03/21/16 (!) 138/100     Patient Active Problem List   Diagnosis Date Noted  . Abnormal abdominal CT scan  see 2015 scan  referral to GYN made 08/03/2014  . Unspecified vitamin D deficiency 10/15/2012  . Essential hypertension, benign 09/24/2012  . History of migraine headaches 09/24/2012  . Overweight(278.02) 09/24/2012    Past Medical History:  Diagnosis Date  . Abnormal Pap smear of cervix    cryotherapy  . Hypertension   . Migraine     Past Surgical History:  Procedure Laterality Date   . LAPAROSCOPIC BILATERAL SALPINGECTOMY Bilateral 03/18/2014   Procedure: LAPAROSCOPIC BILATERAL SALPINGECTOMY;  Surgeon: Allie BossierMyra C Dove, MD;  Location: [facility];  Service: Gynecology;  Laterality: Bilateral;  . LAPAROSCOPIC LYSIS OF ADHESIONS Bilateral 03/18/2014   Procedure: LAPAROSCOPIC LYSIS OF ADHESIONS;  Surgeon: Allie BossierMyra C Dove, MD;  Location: [facility];  Service: Gynecology;  Laterality: Bilateral;    Social History  Substance Use Topics  . Smoking status: Never Smoker  . Smokeless tobacco: Never Used  . Alcohol use No    Family History  Problem Relation Age of Onset  . Hypertension Mother   . Cancer Maternal Aunt 50    colon Cancer  . Cancer Paternal Aunt 3850    brest cancer  . Heart disease Maternal Grandmother   . Stroke Maternal Grandmother   . Stroke Paternal Grandmother     No Known Allergies  Medication list has been reviewed and updated.  Current Outpatient Prescriptions on File Prior to Visit  Medication Sig Dispense Refill  . aspirin-acetaminophen-caffeine (EXCEDRIN MIGRAINE) 250-250-65 MG per tablet Take 1 tablet by mouth every 6 (six) hours as needed for pain.    . Aspirin-Acetaminophen-Caffeine (GOODY HEADACHE PO) Take by mouth.    Marland Kitchen. lisinopril (PRINIVIL,ZESTRIL) 20 MG tablet Take 1 tablet (20 mg total) by mouth daily. 30 tablet 3  .  metoprolol tartrate (LOPRESSOR) 50 MG tablet Take 1 tablet (50 mg total) by mouth 2 (two) times daily. 180 tablet 3   No current facility-administered medications on file prior to visit.     Review of Systems:  As per HPI- otherwise negative.  No RUQ or post-prandial pain noted    Physical Examination: Vitals:   09/08/16 1634  BP: (!) 172/91  Pulse: 77  Temp: 98.3 F (36.8 C)   Vitals:   09/08/16 1634  Weight: 164 lb 12.8 oz (74.8 kg)  Height: 5' (1.524 m)   Body mass index is 32.19 kg/m. Ideal Body Weight: Weight in (lb) to have BMI = 25: 127.7  GEN: WDWN, NAD, Non-toxic, A & O x 3, overweight, looks well HEENT:  Atraumatic, Normocephalic. Neck supple. No masses, No LAD.  Bilateral TM wnl, oropharynx normal.  PEERL,EOMI.    Ears and Nose: No external deformity. CV: RRR, No M/G/R. No JVD. No thrill. No extra heart sounds. PULM: CTA B, no wheezes, crackles, rhonchi. No retractions. No resp. distress. No accessory muscle use. ABD: S, NT, ND, +BS. No rebound. No HSM.  Belly is benign to exam EXTR: No c/c/e NEURO Normal gait.  PSYCH: Normally interactive. Conversant. Not depressed or anxious appearing.  Calm demeanor.    Assessment and Plan: Essential hypertension, benign - Plan: lisinopril (PRINIVIL,ZESTRIL) 40 MG tablet, Basic metabolic panel  Gastroesophageal reflux disease, esophagitis presence not specified - Plan: H. pylori breath test  Epigastric pain - Plan: Lipase  Here today to follow-up on a couple of concerns Her BP is still too high- there is a family history of same Continue metoprolol and will increase lisinopril to 40 mg- she is going to check her pressures and let me know how they look Will check H pylori for her and plan next step    It was good to see you today- we will do some blood work and also do a breath test for H pylori bacteria today.   If you do have H pylori, we will treat this and hopefully your stomach issues will go away.  If you are negative, we will have you see GI We will also increase your lisinopril to 40 mg once a day- continue the metoprolol also Please check your BP a few times over the next couple of weeks and let me know how it looks over [employer],   Results for orders placed or performed in visit on 09/08/16  H. pylori breath test  Result Value Ref Range   H. pylori Breath Test NOT DETECTED Not Detected  Basic metabolic panel  Result Value Ref Range   Sodium 139 135 - 145 mEq/L   Potassium 4.0 3.5 - 5.1 mEq/L   Chloride 106 96 - 112 mEq/L   CO2 27 19 - 32 mEq/L   Glucose, Bld 68 (L) 70 - 99 mg/dL   BUN 15 6 - 23 mg/dL    Creatinine, Ser 1.61 0.40 - 1.20 mg/dL   Calcium 9.3 8.4 - 09.6 mg/dL   GFR 04.54 >09.81 mL/min  Lipase  Result Value Ref Range   Lipase 21.0 11.0 - 59.0 U/L   Referral placed to GI

## 2016-09-08 NOTE — Progress Notes (Signed)
Pre visit review using our clinic review tool, if applicable. No additional management support is needed unless otherwise documented below in the visit note. 

## 2016-09-09 LAB — BASIC METABOLIC PANEL
BUN: 15 mg/dL (ref 6–23)
CALCIUM: 9.3 mg/dL (ref 8.4–10.5)
CHLORIDE: 106 meq/L (ref 96–112)
CO2: 27 mEq/L (ref 19–32)
CREATININE: 0.88 mg/dL (ref 0.40–1.20)
GFR: 89.03 mL/min (ref >60.00)
Glucose, Bld: 68 mg/dL — ABNORMAL LOW (ref 70–99)
Potassium: 4 mEq/L (ref 3.5–5.1)
SODIUM: 139 meq/L (ref 135–145)

## 2016-09-09 LAB — H. PYLORI BREATH TEST: H. pylori Breath Test: NOT DETECTED

## 2016-09-09 LAB — LIPASE: LIPASE: 21 U/L (ref 11.0–59.0)

## 2016-09-09 NOTE — Addendum Note (Signed)
Addended by: Abbe AmsterdamOPLAND, Atiyah Bauer C on: 09/09/2016 02:53 PM   Modules accepted: Orders

## 2016-09-13 ENCOUNTER — Encounter: Payer: Self-pay | Admitting: Internal Medicine

## 2016-09-23 ENCOUNTER — Encounter: Payer: Self-pay | Admitting: Family Medicine

## 2016-09-23 MED ORDER — LISINOPRIL-HYDROCHLOROTHIAZIDE 20-12.5 MG PO TABS: 1.0000 | ORAL_TABLET | Freq: Every day | ORAL | 3 refills | Status: DC

## 2016-11-04 ENCOUNTER — Ambulatory Visit: Payer: 59 | Admitting: Internal Medicine

## 2016-11-24 ENCOUNTER — Ambulatory Visit (HOSPITAL_BASED_OUTPATIENT_CLINIC_OR_DEPARTMENT_OTHER)
Admission: RE | Admit: 2016-11-24 | Discharge: 2016-11-24 | Disposition: A | Discharge status: Home / Self Care | Payer: 59 | Source: Ambulatory Visit | Attending: Family Medicine | Admitting: Family Medicine

## 2016-11-24 ENCOUNTER — Ambulatory Visit (INDEPENDENT_AMBULATORY_CARE_PROVIDER_SITE_OTHER): Payer: 59 | Admitting: Family Medicine

## 2016-11-24 VITALS — BP 136/84 | HR 79 | Temp 98.6°F | Ht 60.0 in | Wt 161.4 lb

## 2016-11-24 DIAGNOSIS — I10 Essential (primary) hypertension: Secondary | ICD-10-CM

## 2016-11-24 DIAGNOSIS — R05 Cough: Secondary | ICD-10-CM

## 2016-11-24 DIAGNOSIS — R053 Chronic cough: Secondary | ICD-10-CM

## 2016-11-24 MED ORDER — MONTELUKAST SODIUM 10 MG PO TABS: 10.0000 mg | ORAL_TABLET | Freq: Every day | ORAL | 3 refills | Status: DC

## 2016-11-24 MED ORDER — LOSARTAN POTASSIUM-HCTZ 50-12.5 MG PO TABS: 1.0000 | ORAL_TABLET | Freq: Every day | ORAL | 3 refills | Status: DC

## 2016-11-24 NOTE — Progress Notes (Signed)
[facility] [address], [address] [place], [place] [address] [phone]  Date:  11/24/2016   Name:  Terri [name]   DOB:  September 07, 1970   MRN:  [id number]  PCP:  Abbe Amsterdam, MD    Chief Complaint: Cough (c/o cough that is occ prod with clear mucus. pt feels like cough has worsened over the past 6  months. )   History of Present Illness:  Terri [name] is a 46 y.o. very pleasant female patient who presents with the following:  She has noted a cough "off an on for like 3 years."    The cough is generally dry, but she may bring up some mucus off an on No hemoptysis  Never a smoker She does not work around dust or chemicals- she works in a medical clinic.  She has been screened for TB and is negative Exercise does not trigger cough, cold or hot air do not trigger cough She may gag from cough but des not vomit She will cough both at home and at work  She tried medication for GERD but this did not help her cough  She is not aware of any wheezing  She tried claritin as well- did not seem to help but she did not take it for too long   LMP was this week She is s/p salpingectomy so no concern of pregnancy Patient Active Problem List   Diagnosis Date Noted  . Abnormal abdominal CT scan  see 2015 scan  referral to GYN made 08/03/2014  . Unspecified vitamin D deficiency 10/15/2012  . Essential hypertension, benign 09/24/2012  . History of migraine headaches 09/24/2012  . Overweight(278.02) 09/24/2012    Past Medical History:  Diagnosis Date  . Abnormal Pap smear of cervix    cryotherapy  . Hypertension   . Migraine     Past Surgical History:  Procedure Laterality Date  . LAPAROSCOPIC BILATERAL SALPINGECTOMY Bilateral 03/18/2014   Procedure: LAPAROSCOPIC BILATERAL SALPINGECTOMY;  Surgeon: Allie Bossier, MD;  Location: [facility];  Service: Gynecology;  Laterality: Bilateral;  . LAPAROSCOPIC LYSIS OF ADHESIONS  Bilateral 03/18/2014   Procedure: LAPAROSCOPIC LYSIS OF ADHESIONS;  Surgeon: Allie Bossier, MD;  Location: [facility];  Service: Gynecology;  Laterality: Bilateral;    Social History  Substance Use Topics  . Smoking status: Never Smoker  . Smokeless tobacco: Never Used  . Alcohol use No    Family History  Problem Relation Age of Onset  . Hypertension Mother   . Cancer Maternal Aunt 50    colon Cancer  . Cancer Paternal Aunt 9    brest cancer  . Heart disease Maternal Grandmother   . Stroke Maternal Grandmother   . Stroke Paternal Grandmother     No Known Allergies  Medication list has been reviewed and updated.  Current Outpatient Prescriptions on File Prior to Visit  Medication Sig Dispense Refill  . aspirin-acetaminophen-caffeine (EXCEDRIN MIGRAINE) 250-250-65 MG per tablet Take 1 tablet by mouth every 6 (six) hours as needed for pain.    . Aspirin-Acetaminophen-Caffeine (GOODY HEADACHE PO) Take by mouth.    Marland Kitchen lisinopril-hydrochlorothiazide (ZESTORETIC) 20-12.5 MG tablet Take 1 tablet by mouth daily. Increase to 2 pills if directed by doctor 60 tablet 3  . metoprolol tartrate (LOPRESSOR) 50 MG tablet Take 1 tablet (50 mg total) by mouth 2 (two) times daily. 180 tablet 3   No current facility-administered medications on file prior to visit.  Review of Systems:  As per HPI- otherwise negative.   Physical Examination: Vitals:   11/24/16 1611  BP: 136/84  Pulse: 79  Temp: 98.6 F (37 C)   Vitals:   11/24/16 1611  Weight: 161 lb 6.4 oz (73.2 kg)  Height: 5' (1.524 m)   Body mass index is 31.52 kg/m. Ideal Body Weight: Weight in (lb) to have BMI = 25: 127.7  GEN: WDWN, NAD, Non-toxic, A & O x 3, overweight, otherwise looks well HEENT: Atraumatic, Normocephalic. Neck supple. No masses, No LAD.  Bilateral TM wnl, oropharynx normal.  PEERL,EOMI.   Ears and Nose: No external deformity. CV: RRR, No M/G/R. No JVD. No thrill. No extra heart sounds. PULM: CTA B, no  wheezes, crackles, rhonchi. No retractions. No resp. distress. No accessory muscle use. ABD: S, NT, ND EXTR: No c/c/e NEURO Normal gait.  PSYCH: Normally interactive. Conversant. Not depressed or anxious appearing.  Calm demeanor.   Dg Chest 2 View  Result Date: 11/24/2016 CLINICAL DATA:  Persistent cough for 3 weeks EXAM: CHEST  2 VIEW COMPARISON:  None FINDINGS: Normal heart size, mediastinal contours, and pulmonary vascularity. Lungs clear. No pleural effusion or pneumothorax. Bones unremarkable. IMPRESSION: Normal exam. Electronically Signed   By: Ulyses Southward M.D.   On: 11/24/2016 17:08    Assessment and Plan: Persistent cough - Plan: DG Chest 2 View, montelukast (SINGULAIR) 10 MG tablet  Essential hypertension - Plan: losartan-hydrochlorothiazide (HYZAAR) 50-12.5 MG tablet  Here today with persistent cough- uncertain etiology Will try changing from lisinopril to losartran Add singulair CXR today She will let me know if her cough is improved- asked her to send me a message in about 1 week  Signed Abbe Amsterdam, MD

## 2016-11-24 NOTE — Patient Instructions (Signed)
Please have your chest x-ray on the ground floor imaging department. Then you can head home- I will be in touch with this result  Let's try changing your lisinopirl/hct to losartan/hct; this might help with the cough. I am also going to add singulair 10 mg once a day.  Please remember that this type of medication should NOT be used in pregnancy   Please let me know how your are doing in about one week.

## 2016-12-02 ENCOUNTER — Encounter: Payer: Self-pay | Admitting: Family Medicine

## 2017-03-27 ENCOUNTER — Other Ambulatory Visit: Payer: Self-pay | Admitting: Family Medicine

## 2017-03-27 DIAGNOSIS — I10 Essential (primary) hypertension: Secondary | ICD-10-CM

## 2017-04-14 ENCOUNTER — Ambulatory Visit: Payer: 59 | Admitting: Gastroenterology

## 2017-04-16 NOTE — Progress Notes (Addendum)
[facility] [address], [address] [place], [place] [address] [phone]  Date:  04/17/2017   Name:  Terri [name]   DOB:  08/20/1970   MRN:  [id number]  PCP:  Pearline Cables, MD    Chief Complaint: No chief complaint on file.   History of Present Illness:  Terri [name] is a 46 y.o. very pleasant female patient who presents with the following:  Here today for a CPE History of HTN, migraine HA Per my last physical about a year ago:  Here today for a CPE- seen about 2 months ago to establish care and discuss her HTN- at that time we started her on lopressor 25 BID .   She brings with her a BP log.   For the last copule of days she increased to 50 mg BID.   No SE of the medicatoin such as feeling lightheaded.    She is fasting today for labs  Last pap a couple of years ago. She never had an abnl pap.  She is s/p BTL Back in 2015 she had an abnl CT abd/ pelvis:  IMPRESSION: 1. Bilateral adrenal glands are normal in appearance. 2. Solitary renal arteries bilaterally, which are widely patent. 3. No alternative explanation for the patient's poorly controlled hypertension. 4. Unusual appearance of the lower uterine segment and cervical region. This is favored to represents normal variant enhancement (likely slightly earlier phase than typically obtained), in association with multiple nabothian cysts. However, there is also slight irregularity of the upper anterior wall of the vagina. Correlation with physical examination and direct inspection is recommended to exclude underlying malignancy.  Pt reports that she saw Dr. Marice Potter and had evaluation. I will follow-up with Dr. Marice Potter to ensure that all is taken care of  She has noted an intermittent cough for about 2 years.  She does notice that the cough will be worse if she is exposed to a bleach based cleaner. No change with inside or outside.  Maybe a little worse  with hot weather.   Cough is dry, no fever.  No hemoptysis, no unintentioanl weight loss  LMP 03/10/16  Her menses are still regular.   She does have a history of migraine- when she has the cough it can be worse She has noted some runny nose and sneezing- she does not take anything for allergies.    Pap last year Flu shot due- she declines today Labs: one year ago She is fasting today for labs  Most recent mammo I have is from 2014- it was negative There is a family history of 2nd degree relative with breast cancer- paternal aunt  Her cough is better, but when it does occur she will get a headache claritin did not seem to help in particular lmp 9/5- menses are regular  She may have some sneezing and runny nose, but not always   She does check her BP on occasion, but not often She feels like her pressure is higiher around her menses   She is taking metoprolol twice a day but is not taking her hyzaar- she has not taken it in several months. She just ran out or otherwise stopped taking it?  She has noted some bilateral ankle edema for the last several years, not painful but can be annoying  Patient Active Problem List   Diagnosis Date Noted  . Abnormal abdominal CT scan  see 2015 scan  referral  to GYN made 08/03/2014  . Unspecified vitamin D deficiency 10/15/2012  . Essential hypertension, benign 09/24/2012  . History of migraine headaches 09/24/2012  . Overweight(278.02) 09/24/2012    Past Medical History:  Diagnosis Date  . Abnormal Pap smear of cervix    cryotherapy  . Hypertension   . Migraine     Past Surgical History:  Procedure Laterality Date  . LAPAROSCOPIC BILATERAL SALPINGECTOMY Bilateral 03/18/2014   Procedure: LAPAROSCOPIC BILATERAL SALPINGECTOMY;  Surgeon: Allie Bossier, MD;  Location: [facility];  Service: Gynecology;  Laterality: Bilateral;  . LAPAROSCOPIC LYSIS OF ADHESIONS Bilateral 03/18/2014   Procedure: LAPAROSCOPIC LYSIS OF ADHESIONS;  Surgeon: Allie Bossier,  MD;  Location: [facility];  Service: Gynecology;  Laterality: Bilateral;    Social History  Substance Use Topics  . Smoking status: Never Smoker  . Smokeless tobacco: Never Used  . Alcohol use No    Family History  Problem Relation Age of Onset  . Hypertension Mother   . Cancer Maternal Aunt 50       colon Cancer  . Cancer Paternal Aunt 57       brest cancer  . Heart disease Maternal Grandmother   . Stroke Maternal Grandmother   . Stroke Paternal Grandmother     No Known Allergies  Medication list has been reviewed and updated.  Current Outpatient Prescriptions on File Prior to Visit  Medication Sig Dispense Refill  . aspirin-acetaminophen-caffeine (EXCEDRIN MIGRAINE) 250-250-65 MG per tablet Take 1 tablet by mouth every 6 (six) hours as needed for pain.    . Aspirin-Acetaminophen-Caffeine (GOODY HEADACHE PO) Take by mouth.    . montelukast (SINGULAIR) 10 MG tablet Take 1 tablet (10 mg total) by mouth at bedtime. 30 tablet 3   No current facility-administered medications on file prior to visit.     Review of Systems:  As per HPI- otherwise negative.   Physical Examination: Vitals:   04/17/17 1218 04/17/17 1219  BP: (!) 168/100 (!) 158/99  Pulse: 92   Temp: 98.4 F (36.9 C)   SpO2: 100%    Vitals:   04/17/17 1218  Weight: 165 lb 3.2 oz (74.9 kg)  Height: 5' (1.524 m)   Body mass index is 32.26 kg/m. Ideal Body Weight: Weight in (lb) to have BMI = 25: 127.7  GEN: WDWN, NAD, Non-toxic, A & O x 3, overweight, otherwise looks well HEENT: Atraumatic, Normocephalic. Neck supple. No masses, No LAD. Bilateral TM wnl, oropharynx normal.  PEERL,EOMI.   Ears and Nose: No external deformity. CV: RRR, No M/G/R. No JVD. No thrill. No extra heart sounds. PULM: CTA B, no wheezes, crackles, rhonchi. No retractions. No resp. distress. No accessory muscle use. ABD: S, NT, ND, +BS. No rebound. No HSM. EXTR: No c/c/trace edema of both LE to the ankle  Her calves are not tender,  no cords noted  NEURO Normal gait.  PSYCH: Normally interactive. Conversant. Not depressed or anxious appearing.  Calm demeanor.    Assessment and Plan: Physical exam  Screening for deficiency anemia - Plan: CBC  History of vitamin D deficiency - Plan: Vitamin D (25 hydroxy)  Screening for diabetes mellitus - Plan: Comprehensive metabolic panel, Hemoglobin A1c  Screening for hyperlipidemia - Plan: Lipid panel  Essential hypertension - Plan: losartan-hydrochlorothiazide (HYZAAR) 50-12.5 MG tablet, metoprolol tartrate (LOPRESSOR) 50 MG tablet  Here today for a CPE Labs pending as above Restart hyzaar as her BP is too high  Encouraged a mammogram Will plan further follow- up pending labs.  Signed Abbe Amsterdam, MD  addnd- received her labs  Results for orders placed or performed in visit on 04/17/17  CBC  Result Value Ref Range   WBC 7.3 4.0 - 10.5 K/uL   RBC 4.55 3.87 - 5.11 Mil/uL   Platelets 357.0 150.0 - 400.0 K/uL   Hemoglobin 12.1 12.0 - 15.0 g/dL   HCT 16.1 09.6 - 04.5 %   MCV 83.2 78.0 - 100.0 fl   MCHC 31.9 30.0 - 36.0 g/dL   RDW 40.9 (H) 81.1 - 91.4 %  Comprehensive metabolic panel  Result Value Ref Range   Sodium 138 135 - 145 mEq/L   Potassium 3.8 3.5 - 5.1 mEq/L   Chloride 105 96 - 112 mEq/L   CO2 25 19 - 32 mEq/L   Glucose, Bld 85 70 - 99 mg/dL   BUN 9 6 - 23 mg/dL   Creatinine, Ser 7.82 0.40 - 1.20 mg/dL   Total Bilirubin 0.6 0.2 - 1.2 mg/dL   Alkaline Phosphatase 59 39 - 117 U/L   AST 23 0 - 37 U/L   ALT 23 0 - 35 U/L   Total Protein 6.8 6.0 - 8.3 g/dL   Albumin 3.8 3.5 - 5.2 g/dL   Calcium 9.6 8.4 - 95.6 mg/dL   GFR 213.08 >65.78 mL/min  Lipid panel  Result Value Ref Range   Cholesterol 123 0 - 200 mg/dL   Triglycerides 46.9 0.0 - 149.0 mg/dL   HDL 62.95 >28.41 mg/dL   VLDL 32.4 0.0 - 40.1 mg/dL   LDL Cholesterol 52 0 - 99 mg/dL   Total CHOL/HDL Ratio 2    NonHDL 66.57   Hemoglobin A1c  Result Value Ref Range   Hgb A1c MFr Bld 5.6  4.6 - 6.5 %  Vitamin D (25 hydroxy)  Result Value Ref Range   VITD 41.02 30.00 - 100.00 ng/mL

## 2017-04-17 ENCOUNTER — Ambulatory Visit (INDEPENDENT_AMBULATORY_CARE_PROVIDER_SITE_OTHER): Payer: 59 | Admitting: Family Medicine

## 2017-04-17 ENCOUNTER — Encounter: Payer: Self-pay | Admitting: Family Medicine

## 2017-04-17 VITALS — BP 158/99 | HR 92 | Temp 98.4°F | Ht 60.0 in | Wt 165.2 lb

## 2017-04-17 DIAGNOSIS — Z13220 Encounter for screening for lipoid disorders: Secondary | ICD-10-CM

## 2017-04-17 DIAGNOSIS — I10 Essential (primary) hypertension: Secondary | ICD-10-CM | POA: Diagnosis not present

## 2017-04-17 DIAGNOSIS — Z8639 Personal history of other endocrine, nutritional and metabolic disease: Secondary | ICD-10-CM

## 2017-04-17 DIAGNOSIS — Z131 Encounter for screening for diabetes mellitus: Secondary | ICD-10-CM

## 2017-04-17 DIAGNOSIS — Z0000 Encounter for general adult medical examination without abnormal findings: Secondary | ICD-10-CM

## 2017-04-17 DIAGNOSIS — Z130 Encounter for screening for diseases of the blood and blood-forming organs and certain disorders involving the immune mechanism: Secondary | ICD-10-CM

## 2017-04-17 LAB — LIPID PANEL
CHOLESTEROL: 123 mg/dL (ref 0–200)
HDL: 56.3 mg/dL (ref >39.00)
LDL CALC: 52 mg/dL (ref 0–99)
NonHDL: 66.57
TRIGLYCERIDES: 71 mg/dL (ref 0.0–149.0)
Total CHOL/HDL Ratio: 2
VLDL: 14.2 mg/dL (ref 0.0–40.0)

## 2017-04-17 LAB — COMPREHENSIVE METABOLIC PANEL
ALBUMIN: 3.8 g/dL (ref 3.5–5.2)
ALT: 23 U/L (ref 0–35)
AST: 23 U/L (ref 0–37)
Alkaline Phosphatase: 59 U/L (ref 39–117)
BUN: 9 mg/dL (ref 6–23)
CALCIUM: 9.6 mg/dL (ref 8.4–10.5)
CHLORIDE: 105 meq/L (ref 96–112)
CO2: 25 mEq/L (ref 19–32)
Creatinine, Ser: 0.77 mg/dL (ref 0.40–1.20)
GFR: 103.58 mL/min (ref >60.00)
Glucose, Bld: 85 mg/dL (ref 70–99)
POTASSIUM: 3.8 meq/L (ref 3.5–5.1)
Sodium: 138 mEq/L (ref 135–145)
Total Bilirubin: 0.6 mg/dL (ref 0.2–1.2)
Total Protein: 6.8 g/dL (ref 6.0–8.3)

## 2017-04-17 LAB — CBC
HEMATOCRIT: 37.9 % (ref 36.0–46.0)
Hemoglobin: 12.1 g/dL (ref 12.0–15.0)
MCHC: 31.9 g/dL (ref 30.0–36.0)
MCV: 83.2 fl (ref 78.0–100.0)
PLATELETS: 357 10*3/uL (ref 150.0–400.0)
RBC: 4.55 Mil/uL (ref 3.87–5.11)
RDW: 16.1 % — ABNORMAL HIGH (ref 11.5–15.5)
WBC: 7.3 10*3/uL (ref 4.0–10.5)

## 2017-04-17 LAB — VITAMIN D 25 HYDROXY (VIT D DEFICIENCY, FRACTURES): VITD: 41.02 ng/mL (ref 30.00–100.00)

## 2017-04-17 LAB — HEMOGLOBIN A1C: Hgb A1c MFr Bld: 5.6 % (ref 4.6–6.5)

## 2017-04-17 MED ORDER — METOPROLOL TARTRATE 50 MG PO TABS: 50.0000 mg | ORAL_TABLET | Freq: Two times a day (BID) | ORAL | 3 refills | Status: DC

## 2017-04-17 MED ORDER — LOSARTAN POTASSIUM-HCTZ 50-12.5 MG PO TABS: 1.0000 | ORAL_TABLET | Freq: Every day | ORAL | 3 refills | Status: DC

## 2017-04-17 NOTE — Patient Instructions (Signed)
It was very nice to see you today! Take care and I will be in touch with your labs  Please schedule your mammogram asap!  The imaging dept here can be reached at [phone]  Please start back on your losartan/ hctz BP medication this will also help with your ankle swelling   Health Maintenance, Female Adopting a healthy lifestyle and getting preventive care can go a long way to promote health and wellness. Talk with your health care provider about what schedule of regular examinations is right for you. This is a good chance for you to check in with your provider about disease prevention and staying healthy. In between checkups, there are plenty of things you can do on your own. Experts have done a lot of research about which lifestyle changes and preventive measures are most likely to keep you healthy. Ask your health care provider for more information. Weight and diet Eat a healthy diet  Be sure to include plenty of vegetables, fruits, low-fat dairy products, and lean protein.  Do not eat a lot of foods high in solid fats, added sugars, or salt.  Get regular exercise. This is one of the most important things you can do for your health. ? Most adults should exercise for at least 150 minutes each week. The exercise should increase your heart rate and make you sweat (moderate-intensity exercise). ? Most adults should also do strengthening exercises at least twice a week. This is in addition to the moderate-intensity exercise.  Maintain a healthy weight  Body mass index (BMI) is a measurement that can be used to identify possible weight problems. It estimates body fat based on height and weight. Your health care provider can help determine your BMI and help you achieve or maintain a healthy weight.  For females 95 years of age and older: ? A BMI below 18.5 is considered underweight. ? A BMI of 18.5 to 24.9 is normal. ? A BMI of 25 to 29.9 is considered overweight. ? A BMI of 30 and above is  considered obese.  Watch levels of cholesterol and blood lipids  You should start having your blood tested for lipids and cholesterol at 46 years of age, then have this test every 5 years.  You may need to have your cholesterol levels checked more often if: ? Your lipid or cholesterol levels are high. ? You are older than 46 years of age. ? You are at high risk for heart disease.  Cancer screening Lung Cancer  Lung cancer screening is recommended for adults 49-60 years old who are at high risk for lung cancer because of a history of smoking.  A yearly low-dose CT scan of the lungs is recommended for people who: ? Currently smoke. ? Have quit within the past 15 years. ? Have at least a 30-pack-year history of smoking. A pack year is smoking an average of one pack of cigarettes a day for 1 year.  Yearly screening should continue until it has been 15 years since you quit.  Yearly screening should stop if you develop a health problem that would prevent you from having lung cancer treatment.  Breast Cancer  Practice breast self-awareness. This means understanding how your breasts normally appear and feel.  It also means doing regular breast self-exams. Let your health care provider know about any changes, no matter how small.  If you are in your 20s or 30s, you should have a clinical breast exam (CBE) by a health care provider every  1-3 years as part of a regular health exam.  If you are 23 or older, have a CBE every year. Also consider having a breast X-ray (mammogram) every year.  If you have a family history of breast cancer, talk to your health care provider about genetic screening.  If you are at high risk for breast cancer, talk to your health care provider about having an MRI and a mammogram every year.  Breast cancer gene (BRCA) assessment is recommended for women who have family members with BRCA-related cancers. BRCA-related cancers  include: ? Breast. ? Ovarian. ? Tubal. ? Peritoneal cancers.  Results of the assessment will determine the need for genetic counseling and BRCA1 and BRCA2 testing.  Cervical Cancer Your health care provider may recommend that you be screened regularly for cancer of the pelvic organs (ovaries, uterus, and vagina). This screening involves a pelvic examination, including checking for microscopic changes to the surface of your cervix (Pap test). You may be encouraged to have this screening done every 3 years, beginning at age 71.  For women ages 51-65, health care providers may recommend pelvic exams and Pap testing every 3 years, or they may recommend the Pap and pelvic exam, combined with testing for human papilloma virus (HPV), every 5 years. Some types of HPV increase your risk of cervical cancer. Testing for HPV may also be done on women of any age with unclear Pap test results.  Other health care providers may not recommend any screening for nonpregnant women who are considered low risk for pelvic cancer and who do not have symptoms. Ask your health care provider if a screening pelvic exam is right for you.  If you have had past treatment for cervical cancer or a condition that could lead to cancer, you need Pap tests and screening for cancer for at least 20 years after your treatment. If Pap tests have been discontinued, your risk factors (such as having a new sexual partner) need to be reassessed to determine if screening should resume. Some women have medical problems that increase the chance of getting cervical cancer. In these cases, your health care provider may recommend more frequent screening and Pap tests.  Colorectal Cancer  This type of cancer can be detected and often prevented.  Routine colorectal cancer screening usually begins at 46 years of age and continues through 46 years of age.  Your health care provider may recommend screening at an earlier age if you have risk factors  for colon cancer.  Your health care provider may also recommend using home test kits to check for hidden blood in the stool.  A small camera at the end of a tube can be used to examine your colon directly (sigmoidoscopy or colonoscopy). This is done to check for the earliest forms of colorectal cancer.  Routine screening usually begins at age 30.  Direct examination of the colon should be repeated every 5-10 years through 46 years of age. However, you may need to be screened more often if early forms of precancerous polyps or small growths are found.  Skin Cancer  Check your skin from head to toe regularly.  Tell your health care provider about any new moles or changes in moles, especially if there is a change in a mole's shape or color.  Also tell your health care provider if you have a mole that is larger than the size of a pencil eraser.  Always use sunscreen. Apply sunscreen liberally and repeatedly throughout the day.  Protect  yourself by wearing long sleeves, pants, a wide-brimmed hat, and sunglasses whenever you are outside.  Heart disease, diabetes, and high blood pressure  High blood pressure causes heart disease and increases the risk of stroke. High blood pressure is more likely to develop in: ? People who have blood pressure in the high end of the normal range (130-139/85-89 mm Hg). ? People who are overweight or obese. ? People who are African American.  If you are 76-29 years of age, have your blood pressure checked every 3-5 years. If you are 55 years of age or older, have your blood pressure checked every year. You should have your blood pressure measured twice-once when you are at a hospital or clinic, and once when you are not at a hospital or clinic. Record the average of the two measurements. To check your blood pressure when you are not at a hospital or clinic, you can use: ? An automated blood pressure machine at a pharmacy. ? A home blood pressure monitor.  If  you are between 64 years and 74 years old, ask your health care provider if you should take aspirin to prevent strokes.  Have regular diabetes screenings. This involves taking a blood sample to check your fasting blood sugar level. ? If you are at a normal weight and have a low risk for diabetes, have this test once every three years after 46 years of age. ? If you are overweight and have a high risk for diabetes, consider being tested at a younger age or more often. Preventing infection Hepatitis B  If you have a higher risk for hepatitis B, you should be screened for this virus. You are considered at high risk for hepatitis B if: ? You were born in a country where hepatitis B is common. Ask your health care provider which countries are considered high risk. ? Your parents were born in a high-risk country, and you have not been immunized against hepatitis B (hepatitis B vaccine). ? You have HIV or AIDS. ? You use needles to inject street drugs. ? You live with someone who has hepatitis B. ? You have had sex with someone who has hepatitis B. ? You get hemodialysis treatment. ? You take certain medicines for conditions, including cancer, organ transplantation, and autoimmune conditions.  Hepatitis C  Blood testing is recommended for: ? Everyone born from [birth year] through 1965. ? Anyone with known risk factors for hepatitis C.  Sexually transmitted infections (STIs)  You should be screened for sexually transmitted infections (STIs) including gonorrhea and chlamydia if: ? You are sexually active and are younger than 46 years of age. ? You are older than 46 years of age and your health care provider tells you that you are at risk for this type of infection. ? Your sexual activity has changed since you were last screened and you are at an increased risk for chlamydia or gonorrhea. Ask your health care provider if you are at risk.  If you do not have HIV, but are at risk, it may be recommended  that you take a prescription medicine daily to prevent HIV infection. This is called pre-exposure prophylaxis (PrEP). You are considered at risk if: ? You are sexually active and do not regularly use condoms or know the HIV status of your partner(s). ? You take drugs by injection. ? You are sexually active with a partner who has HIV.  Talk with your health care provider about whether you are at high risk of being  infected with HIV. If you choose to begin PrEP, you should first be tested for HIV. You should then be tested every 3 months for as long as you are taking PrEP. Pregnancy  If you are premenopausal and you may become pregnant, ask your health care provider about preconception counseling.  If you may become pregnant, take 400 to 800 micrograms (mcg) of folic acid every day.  If you want to prevent pregnancy, talk to your health care provider about birth control (contraception). Osteoporosis and menopause  Osteoporosis is a disease in which the bones lose minerals and strength with aging. This can result in serious bone fractures. Your risk for osteoporosis can be identified using a bone density scan.  If you are 68 years of age or older, or if you are at risk for osteoporosis and fractures, ask your health care provider if you should be screened.  Ask your health care provider whether you should take a calcium or vitamin D supplement to lower your risk for osteoporosis.  Menopause may have certain physical symptoms and risks.  Hormone replacement therapy may reduce some of these symptoms and risks. Talk to your health care provider about whether hormone replacement therapy is right for you. Follow these instructions at home:  Schedule regular health, dental, and eye exams.  Stay current with your immunizations.  Do not use any tobacco products including cigarettes, chewing tobacco, or electronic cigarettes.  If you are pregnant, do not drink alcohol.  If you are  breastfeeding, limit how much and how often you drink alcohol.  Limit alcohol intake to no more than 1 drink per day for nonpregnant women. One drink equals 12 ounces of beer, 5 ounces of wine, or 1 ounces of hard liquor.  Do not use street drugs.  Do not share needles.  Ask your health care provider for help if you need support or information about quitting drugs.  Tell your health care provider if you often feel depressed.  Tell your health care provider if you have ever been abused or do not feel safe at home. This information is not intended to replace advice given to you by your health care provider. Make sure you discuss any questions you have with your health care provider. Document Released: 01/31/2011 Document Revised: 12/24/2015 Document Reviewed: 04/21/2015 Elsevier Interactive Patient Education  [employer].

## 2018-02-14 ENCOUNTER — Telehealth: Payer: Self-pay

## 2018-02-14 NOTE — Telephone Encounter (Signed)
Copied from CRM [phone]#[id number]. Topic: Referral - Request >> Feb 14, 2018  9:41 AM Oneal GroutSebastian, Jennifer S wrote: Reason for CRM: Requesting referral to gastroenterologist for upper stomach pains. Would like appt on 03/16/18

## 2018-02-14 NOTE — Telephone Encounter (Signed)
I have pended GI referral. Please advise.

## 2018-02-14 NOTE — Telephone Encounter (Signed)
Gave her a call back- she has noted some epigastric pain.  She has a very busy schedule and on second thought would rather come and see me as first stop. We will get her scheduled

## 2018-02-28 NOTE — Progress Notes (Addendum)
[facility] [address] [place], [place] [address] [id number] [phone]  Date:  03/01/2018   Name:  PATTIJO [name]   DOB:  [birth year]   MRN:  [id number]  PCP:  Pearline Cables, MD    Chief Complaint: Abdominal Pain (since started mentral period pain has resolved, off and on, upper abdomen, dull ache, trouble sleeping took xanax to help)   History of Present Illness:  Terri [name] is a 47 y.o. very pleasant female patient who presents with the following:  Here today with concern of abdominal pain History of HTN, vit D def, migraine  I last saw her in September for a CPE  Pt had noted an epigastric pain- this first appeared about 8 months ago, resolved after about 3 months, then came back 3-4 months ago.  It would mostly bother her when she laid down at night.  Zantac would help She did not notice any acidic taste in her mouth She did have constipation and hard stool pellets  She started her menses on 7/22- 7/26- her pain seemed to disappear with her menses and she nearly canceled this appt  No vomiting or diarrhea Rarely would have sx during the day No relationship with certain foods  She has noted a cough for a couple of years- we saw her for this back in 2018 and I did give her singulair; however she did not take this for long but she did not really think that it helped Her cough is mostly just dry- she may bring up mucus very rarely  She notes a tickle in her throat a lot  Laughing will cause her to cough   She has never been a smoker or been exposed to any industrial irritants that she is aware of    Patient Active Problem List   Diagnosis Date Noted  . Abnormal abdominal CT scan  see 2015 scan  referral to GYN made 08/03/2014  . Unspecified vitamin D deficiency 10/15/2012  . Essential hypertension, benign 09/24/2012  . History of migraine headaches 09/24/2012  . Overweight(278.02) 09/24/2012    Past  Medical History:  Diagnosis Date  . Abnormal Pap smear of cervix    cryotherapy  . Hypertension   . Migraine     Past Surgical History:  Procedure Laterality Date  . LAPAROSCOPIC BILATERAL SALPINGECTOMY Bilateral 03/18/2014   Procedure: LAPAROSCOPIC BILATERAL SALPINGECTOMY;  Surgeon: Allie Bossier, MD;  Location: [facility];  Service: Gynecology;  Laterality: Bilateral;  . LAPAROSCOPIC LYSIS OF ADHESIONS Bilateral 03/18/2014   Procedure: LAPAROSCOPIC LYSIS OF ADHESIONS;  Surgeon: Allie Bossier, MD;  Location: [facility];  Service: Gynecology;  Laterality: Bilateral;    Social History   Tobacco Use  . Smoking status: Never Smoker  . Smokeless tobacco: Never Used  Substance Use Topics  . Alcohol use: No  . Drug use: No    Family History  Problem Relation Age of Onset  . Hypertension Mother   . Cancer Maternal Aunt 50       colon Cancer  . Cancer Paternal Aunt 77       brest cancer  . Heart disease Maternal Grandmother   . Stroke Maternal Grandmother   . Stroke Paternal Grandmother     No Known Allergies  Medication list has been reviewed and updated.  Current Outpatient Medications on File Prior to Visit  Medication Sig Dispense Refill  . metoprolol tartrate (LOPRESSOR) 50 MG tablet  Take 1 tablet (50 mg total) by mouth 2 (two) times daily. 180 tablet 3   No current facility-administered medications on file prior to visit.     Review of Systems:  As per HPI- otherwise negative. No fever or chills No CP or SOB   Physical Examination: Vitals:   03/01/18 1649  BP: (!) 130/92  Pulse: 74  Resp: 16  Temp: 98.4 F (36.9 C)  SpO2: 100%   Vitals:   03/01/18 1649  Weight: 170 lb 6.4 oz (77.3 kg)  Height: 5' (1.524 m)   Body mass index is 33.28 kg/m. Ideal Body Weight: Weight in (lb) to have BMI = 25: 127.7  GEN: WDWN, NAD, Non-toxic, A & O x 3, obese, looks well  HEENT: Atraumatic, Normocephalic. Neck supple. No masses, No LAD.  Bilateral TM wnl, oropharynx normal.   PEERL,EOMI.   Ears and Nose: No external deformity. CV: RRR, No M/G/R. No JVD. No thrill. No extra heart sounds. PULM: CTA B, no wheezes, crackles, rhonchi. No retractions. No resp. distress. No accessory muscle use. ABD: S, NT, ND, +BS. No rebound. No HSM. Belly is benign  EXTR: No c/c/e NEURO Normal gait.  PSYCH: Normally interactive. Conversant. Not depressed or anxious appearing.  Calm demeanor.    Assessment and Plan: Epigastric pain - Plan: H. pylori breath test, omeprazole (PRILOSEC) 40 MG capsule  Cough - Plan: H. pylori breath test  Suspect both issues may be related to GERD h pylori test today Start on a PPI  Will plan further follow- up pending labs. Discussed diet/ lifestyle changes to help decrease GERD sx   Signed Abbe AmsterdamJessica Copland, MD  Received her H pylori- 8/2  Results for orders placed or performed in visit on 03/01/18  H. pylori breath test  Result Value Ref Range   H. pylori Breath Test NOT DETECTED NOT DETECT  letter to pt

## 2018-03-01 ENCOUNTER — Encounter: Payer: Self-pay | Admitting: Family Medicine

## 2018-03-01 ENCOUNTER — Ambulatory Visit: Payer: BLUE CROSS/BLUE SHIELD | Admitting: Family Medicine

## 2018-03-01 VITALS — BP 130/92 | HR 74 | Temp 98.4°F | Resp 16 | Ht 60.0 in | Wt 170.4 lb

## 2018-03-01 DIAGNOSIS — R059 Cough, unspecified: Secondary | ICD-10-CM

## 2018-03-01 DIAGNOSIS — R1013 Epigastric pain: Secondary | ICD-10-CM

## 2018-03-01 DIAGNOSIS — R05 Cough: Secondary | ICD-10-CM | POA: Diagnosis not present

## 2018-03-01 MED ORDER — OMEPRAZOLE 40 MG PO CPDR: 40.0000 mg | DELAYED_RELEASE_CAPSULE | Freq: Every day | ORAL | 3 refills | Status: DC

## 2018-03-01 NOTE — Patient Instructions (Signed)
I suspect that both your pain and your cough are related to stomach acid. Let's do an H pylori test for you today- if you test positive we will treat for this bacteria.  Meanwhile we will start treatment with a PPI- prilosec- for acid. Take this once a day. Avoid acidic or spicy foods, and try to eat dinner several hours before you go to bed.  If you are still coughing when we do your physical we will repeat a chest x-ray

## 2018-03-02 LAB — H. PYLORI BREATH TEST: H. pylori Breath Test: NOT DETECTED

## 2018-04-21 NOTE — Progress Notes (Addendum)
[facility] [address] [place], [place] [address] [phone] [phone]  Date:  04/23/2018   Name:  Terri [name]   DOB:  05/17/1971   MRN:  [id number]  PCP:  Pearline Cablesopland, Jessica C, MD    Chief Complaint: Annual Exam (no flu shot, eye exam today-vision worsening); Foot Swelling (bilateral feet swelling, several years); and Back Pain (lower back pain, worse when laying down)   History of Present Illness:  Terri [name] is a 47 y.o. very pleasant female patient who presents with the following:  Periodic CPE today History of HTN Last seen here in August with sx suspicious for GERD We treated her with prilosec and got a H pylori but it was negative She is still taking omeprazole and her GERD sx are now resolved.  She does have some cough still but better  Her bilateral ankles have been swollen for years- she would estimate 10 years ago I had put her back on hyzaar last year but she is not taking this for some reason She has also noted left lower back pain that will come and go for about one month   Last CPE was about one year ago- at that time we re-started hyzaar for her BP and continued her metoprolol   Labs: due- fasting today, did not take her BP meds yet.  She is on lopressor BID Immun: declines flu Pap: 8/17, negative  Mammo: ?2014- has not had one since, I will schedule for her There is family history of breast cancer in 2nd degree relatives   Patient Active Problem List   Diagnosis Date Noted  . Abnormal abdominal CT scan  see 2015 scan  referral to GYN made 08/03/2014  . Unspecified vitamin D deficiency 10/15/2012  . Essential hypertension, benign 09/24/2012  . History of migraine headaches 09/24/2012  . Overweight(278.02) 09/24/2012    Past Medical History:  Diagnosis Date  . Abnormal Pap smear of cervix    cryotherapy  . Hypertension   . Migraine     Past Surgical History:  Procedure Laterality Date   . LAPAROSCOPIC BILATERAL SALPINGECTOMY Bilateral 03/18/2014   Procedure: LAPAROSCOPIC BILATERAL SALPINGECTOMY;  Surgeon: Allie BossierMyra C Dove, MD;  Location: [facility];  Service: Gynecology;  Laterality: Bilateral;  . LAPAROSCOPIC LYSIS OF ADHESIONS Bilateral 03/18/2014   Procedure: LAPAROSCOPIC LYSIS OF ADHESIONS;  Surgeon: Allie BossierMyra C Dove, MD;  Location: [facility];  Service: Gynecology;  Laterality: Bilateral;    Social History   Tobacco Use  . Smoking status: Never Smoker  . Smokeless tobacco: Never Used  Substance Use Topics  . Alcohol use: No  . Drug use: No    Family History  Problem Relation Age of Onset  . Hypertension Mother   . Cancer Maternal Aunt 50       colon Cancer  . Cancer Paternal Aunt [id number]       brest cancer  . Heart disease Maternal Grandmother   . Stroke Maternal Grandmother   . Stroke Paternal Grandmother     No Known Allergies  Medication list has been reviewed and updated.  Current Outpatient Medications on File Prior to Visit  Medication Sig Dispense Refill  . metoprolol tartrate (LOPRESSOR) 50 MG tablet Take 1 tablet (50 mg total) by mouth 2 (two) times daily. 180 tablet 3  . omeprazole (PRILOSEC) 40 MG capsule Take 1 capsule (40 mg total) by mouth daily. 30 capsule 3   No current facility-administered  medications on file prior to visit.     Review of Systems:  As per HPI- otherwise negative. Both ankles have seemed swollen to her for years, the left is consistently worse    Physical Examination: Vitals:   04/23/18 1015  BP: (!) 140/96  Pulse: 90  Resp: 16  Temp: 98.7 F (37.1 C)  SpO2: 98%   Vitals:   04/23/18 1015  Weight: 167 lb (75.8 kg)  Height: 5' (1.524 m)   Body mass index is 32.61 kg/m. Ideal Body Weight: Weight in (lb) to have BMI = 25: 127.7  GEN: WDWN, NAD, Non-toxic, A & O x 3, looks well, obese  HEENT: Atraumatic, Normocephalic. Neck supple. No masses, No LAD.  Bilateral TM wnl, oropharynx normal.  PEERL,EOMI.   Ears and Nose: No  external deformity. CV: RRR, No M/G/R. No JVD. No thrill. No extra heart sounds. PULM: CTA B, no wheezes, crackles, rhonchi. No retractions. No resp. distress. No accessory muscle use. ABD: S, NT, ND. No rebound. No HSM. EXTR: No c/c/e NEURO Normal gait.  PSYCH: Normally interactive. Conversant. Not depressed or anxious appearing.  Calm demeanor.  Ankles- mild thickening which does not necessarily seem to be swelling, may be just her build.  More so on the left   Assessment and Plan: Physical exam  Screening for hyperlipidemia - Plan: Lipid panel  Essential hypertension - Plan: CBC, Comprehensive metabolic panel, hydrochlorothiazide (HYDRODIURIL) 12.5 MG tablet, metoprolol tartrate (LOPRESSOR) 50 MG tablet  Screening for diabetes mellitus - Plan: Comprehensive metabolic panel, Hemoglobin A1c  Screening for breast cancer - Plan: MM 3D SCREEN BREAST BILATERAL  CPE today Ordered mammo and encouraged her to do this asap Refilled lopressor.  Add back hctz 12.5 as her BP is not completely controlled and she does have concern about ankle edema  Will plan further follow- up pending labs.   Signed Abbe Amsterdam, MD  Received her labs, message to pt  Results for orders placed or performed in visit on 04/23/18  CBC  Result Value Ref Range   WBC 6.2 4.0 - 10.5 K/uL   RBC 4.60 3.87 - 5.11 Mil/uL   Platelets 331.0 150.0 - 400.0 K/uL   Hemoglobin 12.9 12.0 - 15.0 g/dL   HCT 16.1 09.6 - 04.5 %   MCV 85.8 78.0 - 100.0 fl   MCHC 32.7 30.0 - 36.0 g/dL   RDW 40.9 81.1 - 91.4 %  Comprehensive metabolic panel  Result Value Ref Range   Sodium 139 135 - 145 mEq/L   Potassium 4.2 3.5 - 5.1 mEq/L   Chloride 105 96 - 112 mEq/L   CO2 28 19 - 32 mEq/L   Glucose, Bld 95 70 - 99 mg/dL   BUN 10 6 - 23 mg/dL   Creatinine, Ser 7.82 0.40 - 1.20 mg/dL   Total Bilirubin 0.6 0.2 - 1.2 mg/dL   Alkaline Phosphatase 63 39 - 117 U/L   AST 18 0 - 37 U/L   ALT 20 0 - 35 U/L   Total Protein 6.8 6.0 -  8.3 g/dL   Albumin 3.9 3.5 - 5.2 g/dL   Calcium 9.5 8.4 - 95.6 mg/dL   GFR 21.30 >86.57 mL/min  Hemoglobin A1c  Result Value Ref Range   Hgb A1c MFr Bld 5.9 4.6 - 6.5 %  Lipid panel  Result Value Ref Range   Cholesterol 122 0 - 200 mg/dL   Triglycerides 84.6 0.0 - 149.0 mg/dL   HDL 96.29 >52.84 mg/dL   VLDL 14.6  0.0 - 40.0 mg/dL   LDL Cholesterol 68 0 - 99 mg/dL   Total CHOL/HDL Ratio 3    NonHDL 82.33

## 2018-04-23 ENCOUNTER — Encounter: Payer: Self-pay | Admitting: Family Medicine

## 2018-04-23 ENCOUNTER — Ambulatory Visit (INDEPENDENT_AMBULATORY_CARE_PROVIDER_SITE_OTHER): Payer: BLUE CROSS/BLUE SHIELD | Admitting: Family Medicine

## 2018-04-23 VITALS — BP 140/96 | HR 90 | Temp 98.7°F | Resp 16 | Ht 60.0 in | Wt 167.0 lb

## 2018-04-23 DIAGNOSIS — Z0000 Encounter for general adult medical examination without abnormal findings: Secondary | ICD-10-CM | POA: Diagnosis not present

## 2018-04-23 DIAGNOSIS — Z1231 Encounter for screening mammogram for malignant neoplasm of breast: Secondary | ICD-10-CM

## 2018-04-23 DIAGNOSIS — R7303 Prediabetes: Secondary | ICD-10-CM

## 2018-04-23 DIAGNOSIS — Z13220 Encounter for screening for lipoid disorders: Secondary | ICD-10-CM

## 2018-04-23 DIAGNOSIS — Z131 Encounter for screening for diabetes mellitus: Secondary | ICD-10-CM

## 2018-04-23 DIAGNOSIS — Z1239 Encounter for other screening for malignant neoplasm of breast: Secondary | ICD-10-CM

## 2018-04-23 DIAGNOSIS — I10 Essential (primary) hypertension: Secondary | ICD-10-CM

## 2018-04-23 LAB — LIPID PANEL
CHOLESTEROL: 122 mg/dL (ref 0–200)
HDL: 39.4 mg/dL (ref >39.00)
LDL Cholesterol: 68 mg/dL (ref 0–99)
NONHDL: 82.33
Total CHOL/HDL Ratio: 3
Triglycerides: 73 mg/dL (ref 0.0–149.0)
VLDL: 14.6 mg/dL (ref 0.0–40.0)

## 2018-04-23 LAB — CBC
HEMATOCRIT: 39.5 % (ref 36.0–46.0)
HEMOGLOBIN: 12.9 g/dL (ref 12.0–15.0)
MCHC: 32.7 g/dL (ref 30.0–36.0)
MCV: 85.8 fl (ref 78.0–100.0)
PLATELETS: 331 10*3/uL (ref 150.0–400.0)
RBC: 4.6 Mil/uL (ref 3.87–5.11)
RDW: 14.7 % (ref 11.5–15.5)
WBC: 6.2 10*3/uL (ref 4.0–10.5)

## 2018-04-23 LAB — COMPREHENSIVE METABOLIC PANEL
ALBUMIN: 3.9 g/dL (ref 3.5–5.2)
ALT: 20 U/L (ref 0–35)
AST: 18 U/L (ref 0–37)
Alkaline Phosphatase: 63 U/L (ref 39–117)
BUN: 10 mg/dL (ref 6–23)
CALCIUM: 9.5 mg/dL (ref 8.4–10.5)
CHLORIDE: 105 meq/L (ref 96–112)
CO2: 28 mEq/L (ref 19–32)
CREATININE: 0.88 mg/dL (ref 0.40–1.20)
GFR: 88.4 mL/min (ref >60.00)
Glucose, Bld: 95 mg/dL (ref 70–99)
Potassium: 4.2 mEq/L (ref 3.5–5.1)
SODIUM: 139 meq/L (ref 135–145)
TOTAL PROTEIN: 6.8 g/dL (ref 6.0–8.3)
Total Bilirubin: 0.6 mg/dL (ref 0.2–1.2)

## 2018-04-23 LAB — HEMOGLOBIN A1C: HEMOGLOBIN A1C: 5.9 % (ref 4.6–6.5)

## 2018-04-23 MED ORDER — METOPROLOL TARTRATE 50 MG PO TABS: 50.0000 mg | ORAL_TABLET | Freq: Two times a day (BID) | ORAL | 3 refills | Status: DC

## 2018-04-23 MED ORDER — HYDROCHLOROTHIAZIDE 12.5 MG PO TABS: 12.5000 mg | ORAL_TABLET | Freq: Every day | ORAL | 3 refills | Status: DC

## 2018-04-23 NOTE — Patient Instructions (Addendum)
Great to see you today- take care and I will be in touch with your labs asap I would still encourage you to have a flu shot!   For your blood pressure- continue lopressor Add back HCTZ 12.5 once a day for your BP and for swelling Try stopping the omeprazole ideally we don't want you taking this long term due to potential adverse effects on your bone density  Try tylenol for your lower back symptoms but let me know if you need anything further I ordered a mammogram for you- please try and get this done today or schedule for later if need be   Health Maintenance, Female Adopting a healthy lifestyle and getting preventive care can go a long way to promote health and wellness. Talk with your health care provider about what schedule of regular examinations is right for you. This is a good chance for you to check in with your provider about disease prevention and staying healthy. In between checkups, there are plenty of things you can do on your own. Experts have done a lot of research about which lifestyle changes and preventive measures are most likely to keep you healthy. Ask your health care provider for more information. Weight and diet Eat a healthy diet  Be sure to include plenty of vegetables, fruits, low-fat dairy products, and lean protein.  Do not eat a lot of foods high in solid fats, added sugars, or salt.  Get regular exercise. This is one of the most important things you can do for your health. ? Most adults should exercise for at least 150 minutes each week. The exercise should increase your heart rate and make you sweat (moderate-intensity exercise). ? Most adults should also do strengthening exercises at least twice a week. This is in addition to the moderate-intensity exercise.  Maintain a healthy weight  Body mass index (BMI) is a measurement that can be used to identify possible weight problems. It estimates body fat based on height and weight. Your health care provider can  help determine your BMI and help you achieve or maintain a healthy weight.  For females 44 years of age and older: ? A BMI below 18.5 is considered underweight. ? A BMI of 18.5 to 24.9 is normal. ? A BMI of 25 to 29.9 is considered overweight. ? A BMI of 30 and above is considered obese.  Watch levels of cholesterol and blood lipids  You should start having your blood tested for lipids and cholesterol at 47 years of age, then have this test every 5 years.  You may need to have your cholesterol levels checked more often if: ? Your lipid or cholesterol levels are high. ? You are older than 47 years of age. ? You are at high risk for heart disease.  Cancer screening Lung Cancer  Lung cancer screening is recommended for adults 35-95 years old who are at high risk for lung cancer because of a history of smoking.  A yearly low-dose CT scan of the lungs is recommended for people who: ? Currently smoke. ? Have quit within the past 15 years. ? Have at least a 30-pack-year history of smoking. A pack year is smoking an average of one pack of cigarettes a day for 1 year.  Yearly screening should continue until it has been 15 years since you quit.  Yearly screening should stop if you develop a health problem that would prevent you from having lung cancer treatment.  Breast Cancer  Practice breast self-awareness. This  means understanding how your breasts normally appear and feel.  It also means doing regular breast self-exams. Let your health care provider know about any changes, no matter how small.  If you are in your 20s or 30s, you should have a clinical breast exam (CBE) by a health care provider every 1-3 years as part of a regular health exam.  If you are 7 or older, have a CBE every year. Also consider having a breast X-ray (mammogram) every year.  If you have a family history of breast cancer, talk to your health care provider about genetic screening.  If you are at high risk  for breast cancer, talk to your health care provider about having an MRI and a mammogram every year.  Breast cancer gene (BRCA) assessment is recommended for women who have family members with BRCA-related cancers. BRCA-related cancers include: ? Breast. ? Ovarian. ? Tubal. ? Peritoneal cancers.  Results of the assessment will determine the need for genetic counseling and BRCA1 and BRCA2 testing.  Cervical Cancer Your health care provider may recommend that you be screened regularly for cancer of the pelvic organs (ovaries, uterus, and vagina). This screening involves a pelvic examination, including checking for microscopic changes to the surface of your cervix (Pap test). You may be encouraged to have this screening done every 3 years, beginning at age 109.  For women ages 80-65, health care providers may recommend pelvic exams and Pap testing every 3 years, or they may recommend the Pap and pelvic exam, combined with testing for human papilloma virus (HPV), every 5 years. Some types of HPV increase your risk of cervical cancer. Testing for HPV may also be done on women of any age with unclear Pap test results.  Other health care providers may not recommend any screening for nonpregnant women who are considered low risk for pelvic cancer and who do not have symptoms. Ask your health care provider if a screening pelvic exam is right for you.  If you have had past treatment for cervical cancer or a condition that could lead to cancer, you need Pap tests and screening for cancer for at least 20 years after your treatment. If Pap tests have been discontinued, your risk factors (such as having a new sexual partner) need to be reassessed to determine if screening should resume. Some women have medical problems that increase the chance of getting cervical cancer. In these cases, your health care provider may recommend more frequent screening and Pap tests.  Colorectal Cancer  This type of cancer can be  detected and often prevented.  Routine colorectal cancer screening usually begins at 47 years of age and continues through 47 years of age.  Your health care provider may recommend screening at an earlier age if you have risk factors for colon cancer.  Your health care provider may also recommend using home test kits to check for hidden blood in the stool.  A small camera at the end of a tube can be used to examine your colon directly (sigmoidoscopy or colonoscopy). This is done to check for the earliest forms of colorectal cancer.  Routine screening usually begins at age 49.  Direct examination of the colon should be repeated every 5-10 years through 47 years of age. However, you may need to be screened more often if early forms of precancerous polyps or small growths are found.  Skin Cancer  Check your skin from head to toe regularly.  Tell your health care provider about any new  moles or changes in moles, especially if there is a change in a mole's shape or color.  Also tell your health care provider if you have a mole that is larger than the size of a pencil eraser.  Always use sunscreen. Apply sunscreen liberally and repeatedly throughout the day.  Protect yourself by wearing long sleeves, pants, a wide-brimmed hat, and sunglasses whenever you are outside.  Heart disease, diabetes, and high blood pressure  High blood pressure causes heart disease and increases the risk of stroke. High blood pressure is more likely to develop in: ? People who have blood pressure in the high end of the normal range (130-139/85-89 mm Hg). ? People who are overweight or obese. ? People who are African American.  If you are 19-76 years of age, have your blood pressure checked every 3-5 years. If you are 72 years of age or older, have your blood pressure checked every year. You should have your blood pressure measured twice-once when you are at a hospital or clinic, and once when you are not at a  hospital or clinic. Record the average of the two measurements. To check your blood pressure when you are not at a hospital or clinic, you can use: ? An automated blood pressure machine at a pharmacy. ? A home blood pressure monitor.  If you are between 23 years and 72 years old, ask your health care provider if you should take aspirin to prevent strokes.  Have regular diabetes screenings. This involves taking a blood sample to check your fasting blood sugar level. ? If you are at a normal weight and have a low risk for diabetes, have this test once every three years after 47 years of age. ? If you are overweight and have a high risk for diabetes, consider being tested at a younger age or more often. Preventing infection Hepatitis B  If you have a higher risk for hepatitis B, you should be screened for this virus. You are considered at high risk for hepatitis B if: ? You were born in a country where hepatitis B is common. Ask your health care provider which countries are considered high risk. ? Your parents were born in a high-risk country, and you have not been immunized against hepatitis B (hepatitis B vaccine). ? You have HIV or AIDS. ? You use needles to inject street drugs. ? You live with someone who has hepatitis B. ? You have had sex with someone who has hepatitis B. ? You get hemodialysis treatment. ? You take certain medicines for conditions, including cancer, organ transplantation, and autoimmune conditions.  Hepatitis C  Blood testing is recommended for: ? Everyone born from [birth year] through 1965. ? Anyone with known risk factors for hepatitis C.  Sexually transmitted infections (STIs)  You should be screened for sexually transmitted infections (STIs) including gonorrhea and chlamydia if: ? You are sexually active and are younger than 47 years of age. ? You are older than 47 years of age and your health care provider tells you that you are at risk for this type of  infection. ? Your sexual activity has changed since you were last screened and you are at an increased risk for chlamydia or gonorrhea. Ask your health care provider if you are at risk.  If you do not have HIV, but are at risk, it may be recommended that you take a prescription medicine daily to prevent HIV infection. This is called pre-exposure prophylaxis (PrEP). You are considered at risk  if: ? You are sexually active and do not regularly use condoms or know the HIV status of your partner(s). ? You take drugs by injection. ? You are sexually active with a partner who has HIV.  Talk with your health care provider about whether you are at high risk of being infected with HIV. If you choose to begin PrEP, you should first be tested for HIV. You should then be tested every 3 months for as long as you are taking PrEP. Pregnancy  If you are premenopausal and you may become pregnant, ask your health care provider about preconception counseling.  If you may become pregnant, take 400 to 800 micrograms (mcg) of folic acid every day.  If you want to prevent pregnancy, talk to your health care provider about birth control (contraception). Osteoporosis and menopause  Osteoporosis is a disease in which the bones lose minerals and strength with aging. This can result in serious bone fractures. Your risk for osteoporosis can be identified using a bone density scan.  If you are 78 years of age or older, or if you are at risk for osteoporosis and fractures, ask your health care provider if you should be screened.  Ask your health care provider whether you should take a calcium or vitamin D supplement to lower your risk for osteoporosis.  Menopause may have certain physical symptoms and risks.  Hormone replacement therapy may reduce some of these symptoms and risks. Talk to your health care provider about whether hormone replacement therapy is right for you. Follow these instructions at home:  Schedule  regular health, dental, and eye exams.  Stay current with your immunizations.  Do not use any tobacco products including cigarettes, chewing tobacco, or electronic cigarettes.  If you are pregnant, do not drink alcohol.  If you are breastfeeding, limit how much and how often you drink alcohol.  Limit alcohol intake to no more than 1 drink per day for nonpregnant women. One drink equals 12 ounces of beer, 5 ounces of wine, or 1 ounces of hard liquor.  Do not use street drugs.  Do not share needles.  Ask your health care provider for help if you need support or information about quitting drugs.  Tell your health care provider if you often feel depressed.  Tell your health care provider if you have ever been abused or do not feel safe at home. This information is not intended to replace advice given to you by your health care provider. Make sure you discuss any questions you have with your health care provider. Document Released: 01/31/2011 Document Revised: 12/24/2015 Document Reviewed: 04/21/2015 Elsevier Interactive Patient Education  [employer].

## 2018-05-09 ENCOUNTER — Ambulatory Visit (HOSPITAL_BASED_OUTPATIENT_CLINIC_OR_DEPARTMENT_OTHER)
Admission: RE | Admit: 2018-05-09 | Discharge: 2018-05-09 | Disposition: A | Discharge status: Home / Self Care | Payer: BLUE CROSS/BLUE SHIELD | Source: Ambulatory Visit | Attending: Family Medicine | Admitting: Family Medicine

## 2018-05-09 ENCOUNTER — Encounter (HOSPITAL_BASED_OUTPATIENT_CLINIC_OR_DEPARTMENT_OTHER): Payer: Self-pay

## 2018-05-09 DIAGNOSIS — Z1239 Encounter for other screening for malignant neoplasm of breast: Secondary | ICD-10-CM | POA: Diagnosis not present

## 2018-05-09 DIAGNOSIS — Z1231 Encounter for screening mammogram for malignant neoplasm of breast: Secondary | ICD-10-CM | POA: Diagnosis not present

## 2018-05-11 ENCOUNTER — Other Ambulatory Visit: Payer: Self-pay | Admitting: Family Medicine

## 2018-05-11 DIAGNOSIS — R928 Other abnormal and inconclusive findings on diagnostic imaging of breast: Secondary | ICD-10-CM

## 2018-05-15 ENCOUNTER — Telehealth: Payer: Self-pay | Admitting: *Deleted

## 2018-05-15 NOTE — Telephone Encounter (Signed)
Received Physician Orders from [facility];, forwarded to provider/SLS 10/15

## 2018-05-22 ENCOUNTER — Other Ambulatory Visit: Payer: Self-pay | Admitting: Family Medicine

## 2018-05-22 ENCOUNTER — Ambulatory Visit
Admission: RE | Admit: 2018-05-22 | Discharge: 2018-05-22 | Disposition: A | Discharge status: Home / Self Care | Payer: BLUE CROSS/BLUE SHIELD | Source: Ambulatory Visit | Attending: Family Medicine | Admitting: Family Medicine

## 2018-05-22 DIAGNOSIS — R928 Other abnormal and inconclusive findings on diagnostic imaging of breast: Secondary | ICD-10-CM

## 2018-05-22 DIAGNOSIS — N6310 Unspecified lump in the right breast, unspecified quadrant: Secondary | ICD-10-CM

## 2018-05-22 DIAGNOSIS — N6002 Solitary cyst of left breast: Secondary | ICD-10-CM | POA: Diagnosis not present

## 2018-05-22 DIAGNOSIS — N6011 Diffuse cystic mastopathy of right breast: Secondary | ICD-10-CM | POA: Diagnosis not present

## 2018-05-22 DIAGNOSIS — N6311 Unspecified lump in the right breast, upper outer quadrant: Secondary | ICD-10-CM | POA: Diagnosis not present

## 2018-05-25 ENCOUNTER — Telehealth: Payer: Self-pay | Admitting: *Deleted

## 2018-05-25 NOTE — Telephone Encounter (Signed)
Received Dermatopathology Report results from [facility]; forwarded to provider/SLS 10/25    

## 2018-07-02 ENCOUNTER — Encounter: Payer: Self-pay | Admitting: Family Medicine

## 2018-07-02 ENCOUNTER — Telehealth: Payer: Self-pay

## 2018-07-02 DIAGNOSIS — R7303 Prediabetes: Secondary | ICD-10-CM

## 2018-07-02 NOTE — Addendum Note (Signed)
Addended by: Abbe AmsterdamOPLAND, Obie Silos C on: 07/02/2018 04:51 PM   Modules accepted: Orders

## 2018-07-02 NOTE — Telephone Encounter (Signed)
Copied from CRM [phone]. Topic: Appointment Scheduling - Scheduling Inquiry for Clinic >> Jul 02, 2018 11:44 AM Jolayne Hainesaylor, Brittany L wrote: Reason for CRM: patient states she was in the office for her physical back in September and her labs were showing she was pre-diabetic. Patient would like to know can she come in just to have that checked to see where her numbers are? Please advise.

## 2018-08-22 ENCOUNTER — Ambulatory Visit: Payer: BLUE CROSS/BLUE SHIELD | Admitting: Family Medicine

## 2018-10-15 ENCOUNTER — Ambulatory Visit: Payer: BLUE CROSS/BLUE SHIELD | Admitting: Family Medicine

## 2019-05-06 ENCOUNTER — Other Ambulatory Visit: Payer: Self-pay | Admitting: Family Medicine

## 2019-05-06 DIAGNOSIS — I10 Essential (primary) hypertension: Secondary | ICD-10-CM

## 2019-06-26 ENCOUNTER — Other Ambulatory Visit: Payer: Self-pay | Admitting: Family Medicine

## 2019-06-26 DIAGNOSIS — I10 Essential (primary) hypertension: Secondary | ICD-10-CM

## 2019-07-07 IMAGING — MG MM CLIP PLACEMENT
2 series · 2 of 2 positions shown · non-contrast
Comparison: Previous exam(s).

CLINICAL DATA: Post ultrasound-guided biopsy of a mass in the right
breast at the [DATE] position.

EXAM:
DIAGNOSTIC RIGHT MAMMOGRAM POST ULTRASOUND BIOPSY

[R CC]
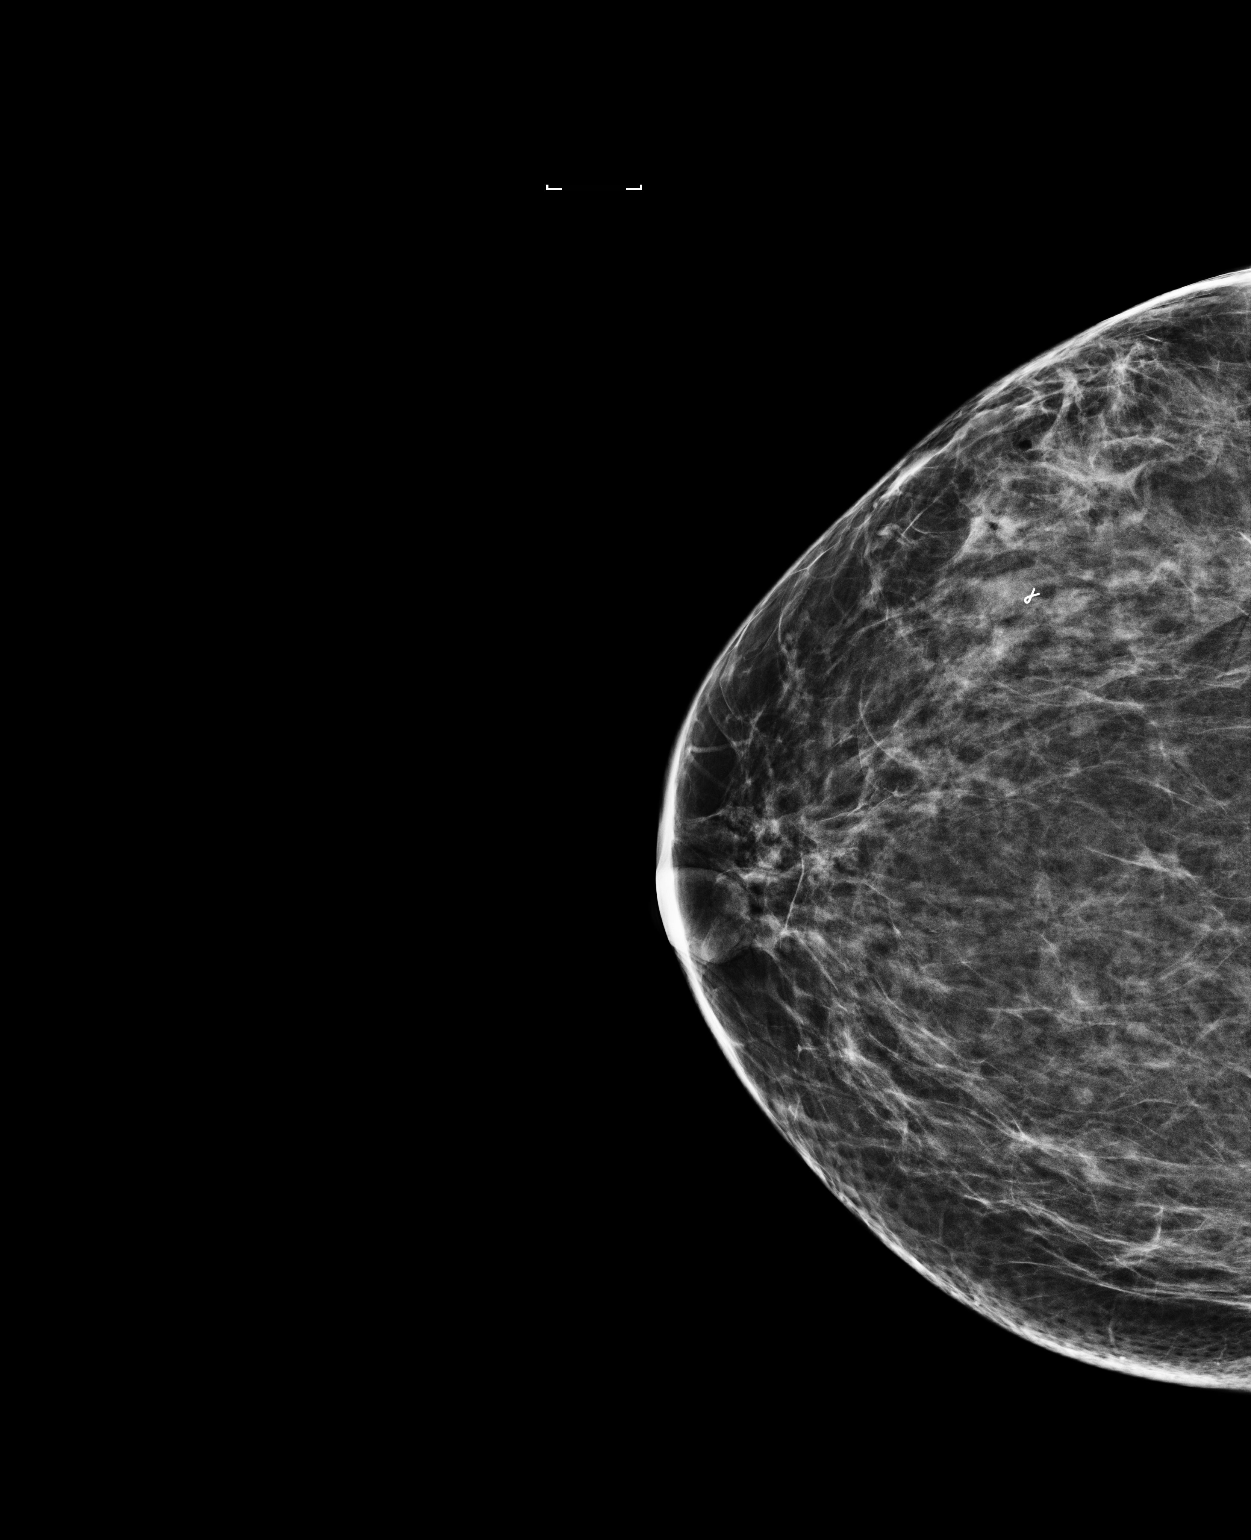

[R ML]
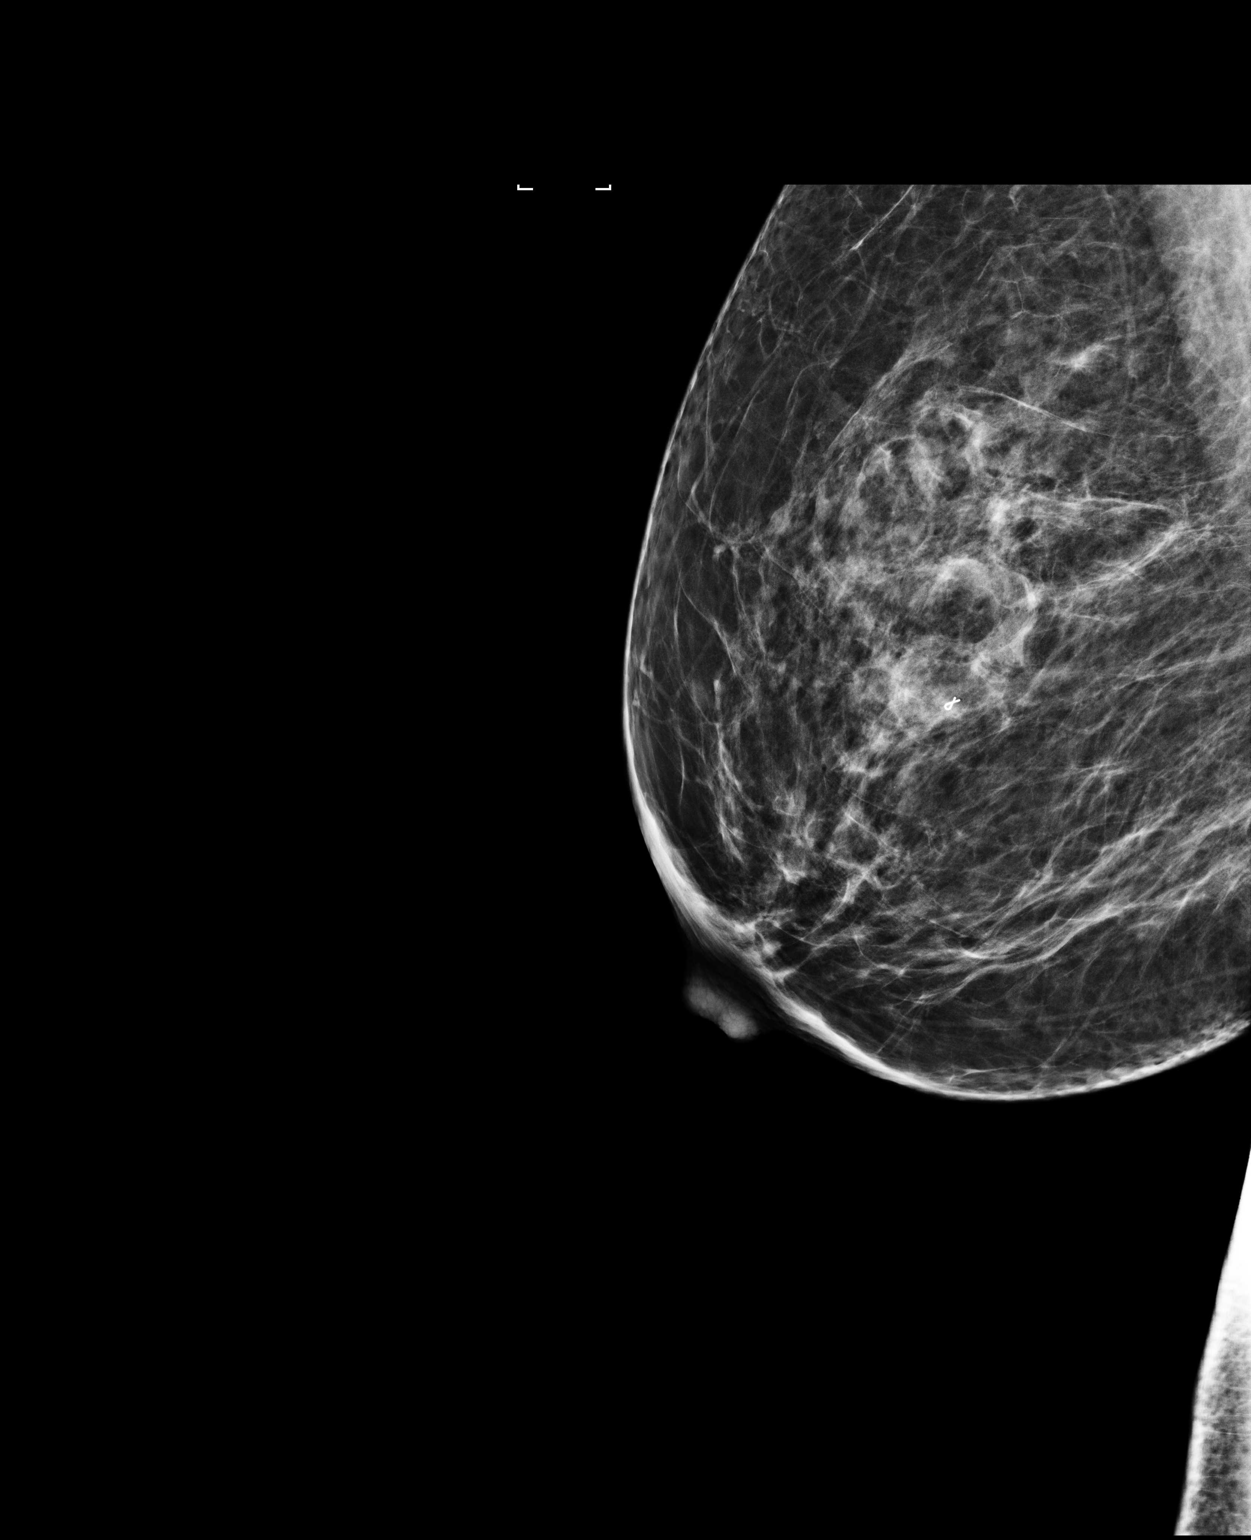

[2 of 2 positions shown; findings below may reference images not displayed]

FINDINGS: Mammographic images were obtained following ultrasound guided biopsy
of a mass in the right breast at the [DATE] position. A ribbon shaped
biopsy marking clip is present at the site of the biopsied mass in
the right breast.
IMPRESSION: Ribbon shaped biopsy marking clip at site of biopsied mass in the
right breast at the [DATE] position.

Final Assessment: Post Procedure Mammograms for Marker Placement

## 2019-07-07 IMAGING — MG DIGITAL DIAGNOSTIC BILATERAL MAMMOGRAM WITH TOMO AND CAD
8 series · 8 of 24 positions shown · non-contrast
Comparison: Previous exam(s).

CLINICAL DATA: Possible masses in bilateral breasts seen on most
recent screening mammography.

EXAM:
DIGITAL DIAGNOSTIC BILATERAL MAMMOGRAM WITH CAD AND TOMO
ULTRASOUND BILATERAL BREAST

[R ML synth-2D]
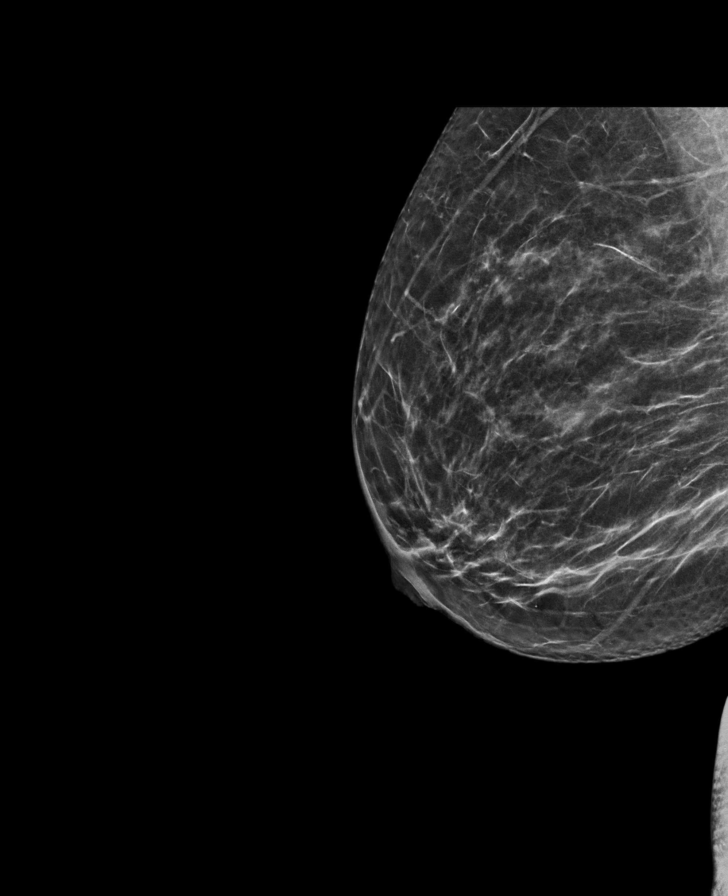

[L CC synth-2D]
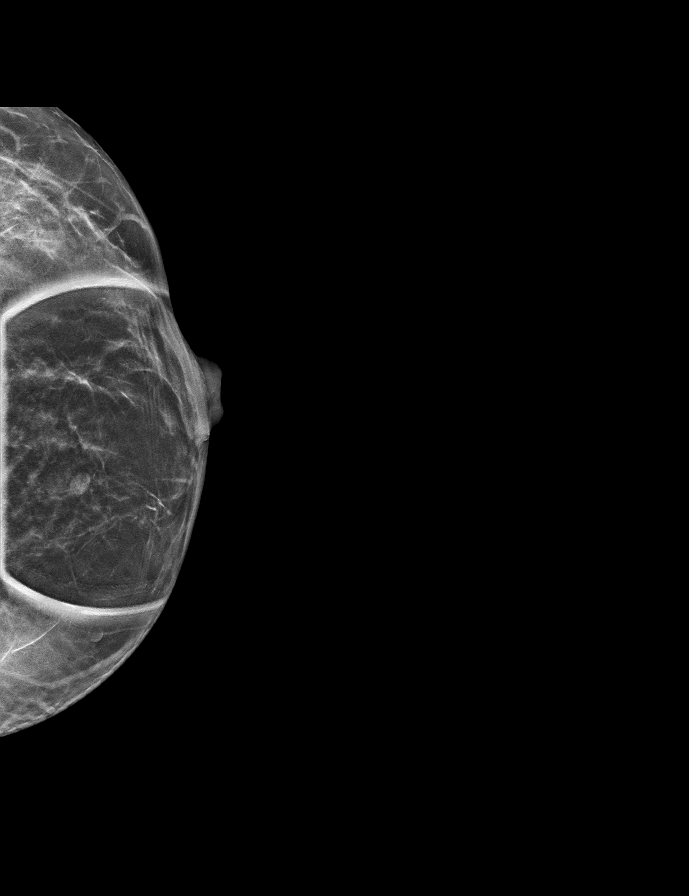

[L MLO synth-2D]
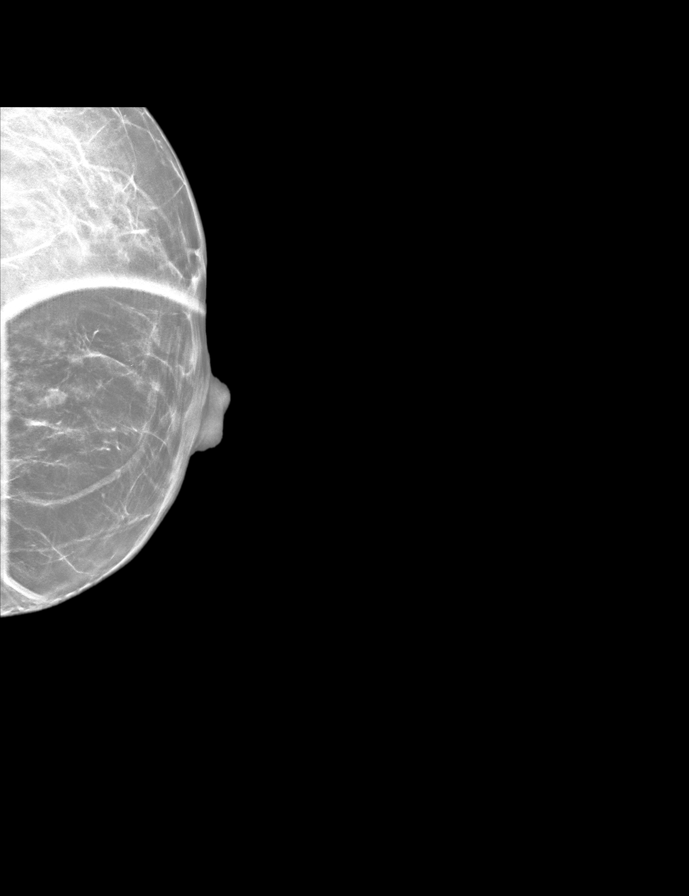

[R MLO synth-2D]
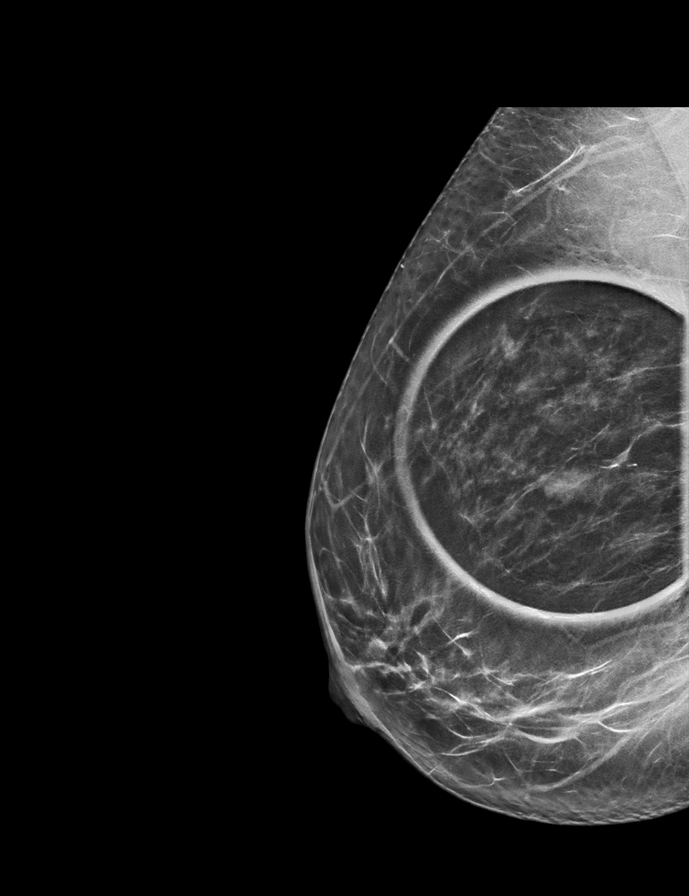

[R MLO tomo · tomo slice 33/64.0]
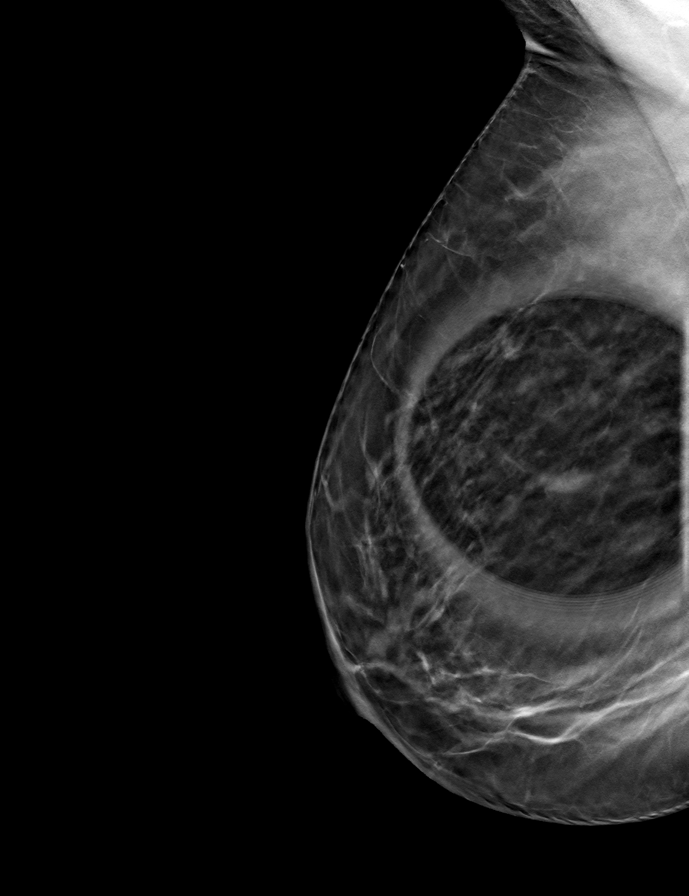

[L CC tomo · tomo slice 22/43.0]
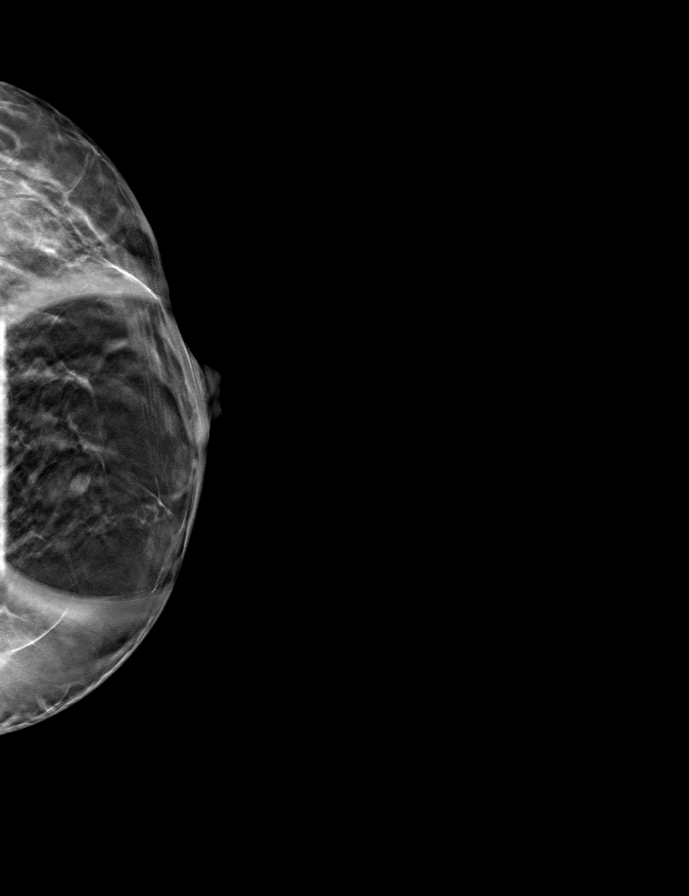

[L MLO tomo · tomo slice 24/47.0]
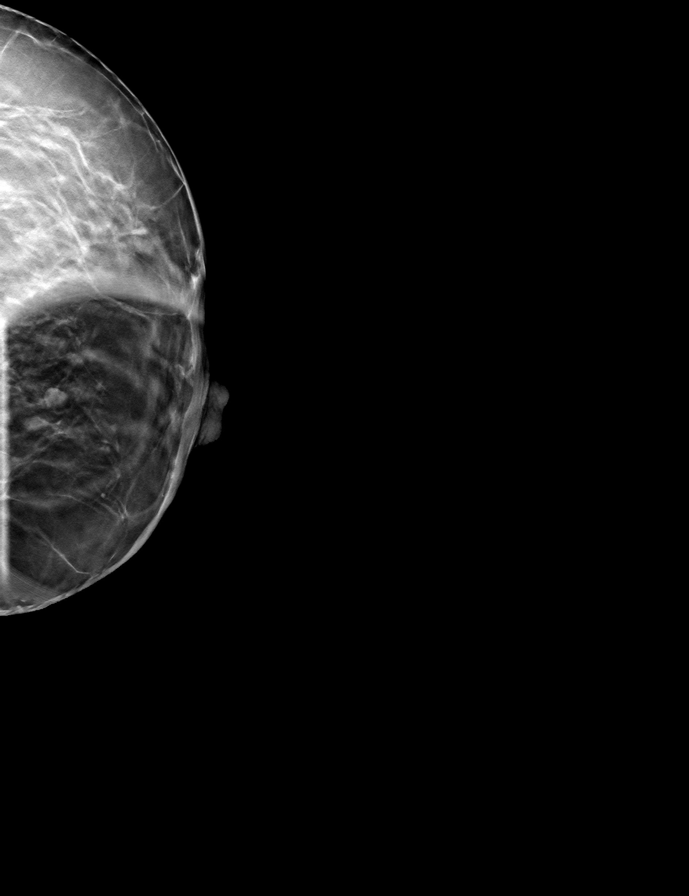

[R ML tomo · tomo slice 33/66.0]
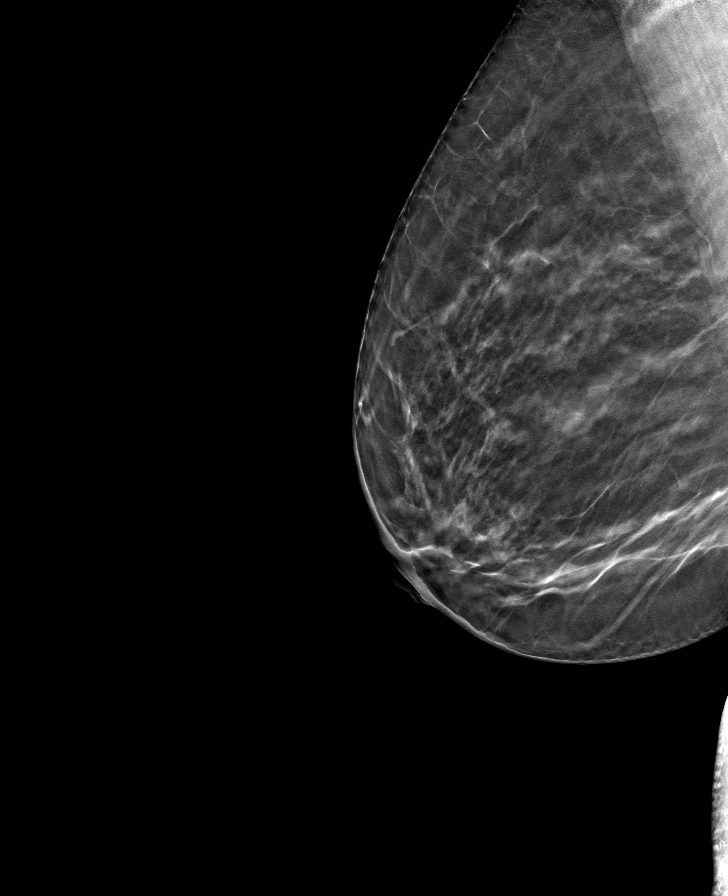

[8 of 24 positions shown; findings below may reference images not displayed]

ACR Breast Density Category b: There are scattered areas of
fibroglandular density.
FINDINGS: Additional mammographic views demonstrate persistent circumscribed 5
mm nodule in the left inner breast, anterior depth. There is a
low-density circumscribed less than 1 cm mass in the right breast
upper outer quadrant, middle depth as well.

Mammographic images were processed with CAD.

On physical exam, no suspicious masses are palpated.

Targeted left breast ultrasound is performed, showing 11 o'clock 2
cm from the nipple benign-appearing hypoechoic circumscribed nodule
measuring 0.6 x 0.4 x 0.3 cm, consistent with a complicated cyst.
This finding corresponds to the mammographically seen nodule.

Targeted right breast ultrasound was performed showing 9:30 o'clock
5 cm from the nipple slightly irregular hypoechoic mass which
measures 0.7 x 0.6 x 0.8 cm. This finding corresponds to the
mammographically seen mass. There is no evidence of right axillary
lymphadenopathy.
IMPRESSION: Left breast 11 o'clock benign-appearing cyst corresponds to the
mammographically seen abnormality on the most recent screening
mammogram.

Right breast 9:30 o'clock indeterminate 8 mm mass, for which
ultrasound-guided core needle biopsy is recommended.

No evidence of right axillary lymphadenopathy.

RECOMMENDATION:
Ultrasound-guided core needle biopsy of the right breast.

I have discussed the findings and recommendations with the patient.
Results were also provided in writing at the conclusion of the
visit. If applicable, a reminder letter will be sent to the patient
regarding the next appointment.

BI-RADS CATEGORY  4: Suspicious.

## 2019-07-09 ENCOUNTER — Ambulatory Visit (INDEPENDENT_AMBULATORY_CARE_PROVIDER_SITE_OTHER): Payer: Self-pay | Admitting: Family Medicine

## 2019-07-09 ENCOUNTER — Other Ambulatory Visit: Payer: Self-pay

## 2019-07-09 DIAGNOSIS — R05 Cough: Secondary | ICD-10-CM

## 2019-07-09 DIAGNOSIS — R059 Cough, unspecified: Secondary | ICD-10-CM

## 2019-07-09 DIAGNOSIS — I10 Essential (primary) hypertension: Secondary | ICD-10-CM

## 2019-07-09 MED ORDER — FAMOTIDINE 40 MG PO TABS: 40.0000 mg | ORAL_TABLET | Freq: Every day | ORAL | 0 refills | Status: DC

## 2019-07-09 MED ORDER — BENZONATATE 100 MG PO CAPS: 100.0000 mg | ORAL_CAPSULE | Freq: Two times a day (BID) | ORAL | 0 refills | Status: DC | PRN

## 2019-07-09 MED ORDER — HYDROCODONE-HOMATROPINE 5-1.5 MG/5ML PO SYRP: 5.0000 mL | ORAL_SOLUTION | Freq: Every evening | ORAL | 0 refills | Status: DC | PRN

## 2019-07-15 DIAGNOSIS — R059 Cough, unspecified: Secondary | ICD-10-CM | POA: Insufficient documentation

## 2019-07-15 DIAGNOSIS — R05 Cough: Secondary | ICD-10-CM | POA: Insufficient documentation

## 2019-07-15 NOTE — Assessment & Plan Note (Signed)
Worsening but persistent for some time. Start Famotidine qhs and prescribe Tessalon perles for cough during the day and Hydromet for qhs. Encouraged increased rest and hydration, add probiotics, zinc such as Coldeze or Xicam. Treat fevers as needed. Report if no improvement.

## 2019-07-15 NOTE — Progress Notes (Signed)
Virtual Visit via phone Note  I connected with Terri [name] on 07/09/19 at 11:00 AM EST by a phone enabled telemedicine application and verified that I am speaking with the correct person using two identifiers.  Location: Patient: home Provider: home   I discussed the limitations of evaluation and management by telemedicine and the availability of in person appointments. The patient expressed understanding and agreed to proceed. CMA was able to get the patient setup on a phone visit after being unable to set up a video visit.    Subjective:    Patient ID: Terri [name], female    DOB: Feb 22, 1971, 48 y.o.   MRN: [id number]  No chief complaint on file.   HPI Patient is in today for evaluation of cough. No recent febrile illness and she does have a chronic cough but it has worsened recently. She does note some ongoing cough but it has worsened recently. Some congestion as well. Denies CP/palp/SOB/HA/fevers/GI or GU c/o. Taking meds as prescribed  Past Medical History:  Diagnosis Date  . Abnormal Pap smear of cervix    cryotherapy  . Hypertension   . Migraine     Past Surgical History:  Procedure Laterality Date  . LAPAROSCOPIC BILATERAL SALPINGECTOMY Bilateral 03/18/2014   Procedure: LAPAROSCOPIC BILATERAL SALPINGECTOMY;  Surgeon: Allie Bossier, MD;  Location: [facility];  Service: Gynecology;  Laterality: Bilateral;  . LAPAROSCOPIC LYSIS OF ADHESIONS Bilateral 03/18/2014   Procedure: LAPAROSCOPIC LYSIS OF ADHESIONS;  Surgeon: Allie Bossier, MD;  Location: [facility];  Service: Gynecology;  Laterality: Bilateral;    Family History  Problem Relation Age of Onset  . Hypertension Mother   . Cancer Maternal Aunt 50       colon Cancer  . Cancer Paternal Aunt 68       brest cancer  . Heart disease Maternal Grandmother   . Stroke Maternal Grandmother   . Stroke Paternal Grandmother     Social History   Socioeconomic History  . Marital status: Single    Spouse name: Not on file  .  Number of children: Not on file  . Years of education: Not on file  . Highest education level: Not on file  Occupational History  . Occupation: Admissions Service Associate  Tobacco Use  . Smoking status: Never Smoker  . Smokeless tobacco: Never Used  Substance and Sexual Activity  . Alcohol use: No  . Drug use: No  . Sexual activity: Yes    Birth control/protection: Condom  Other Topics Concern  . Not on file  Social History Narrative  . Not on file   Social Determinants of Health   Financial Resource Strain:   . Difficulty of Paying Living Expenses: Not on file  Food Insecurity:   . Worried About Programme researcher, broadcasting/film/video in the Last Year: Not on file  . Ran Out of Food in the Last Year: Not on file  Transportation Needs:   . Lack of Transportation (Medical): Not on file  . Lack of Transportation (Non-Medical): Not on file  Physical Activity:   . Days of Exercise per Week: Not on file  . Minutes of Exercise per Session: Not on file  Stress:   . Feeling of Stress : Not on file  Social Connections:   . Frequency of Communication with Friends and Family: Not on file  . Frequency of Social Gatherings with Friends and Family: Not on file  . Attends Religious Services: Not on file  . Active Member of  Clubs or Organizations: Not on file  . Attends BankerClub or Organization Meetings: Not on file  . Marital Status: Not on file  Intimate Partner Violence:   . Fear of Current or Ex-Partner: Not on file  . Emotionally Abused: Not on file  . Physically Abused: Not on file  . Sexually Abused: Not on file    Outpatient Medications Prior to Visit  Medication Sig Dispense Refill  . hydrochlorothiazide (HYDRODIURIL) 12.5 MG tablet Take 1 tablet (12.5 mg total) by mouth daily. 90 tablet 3  . metoprolol tartrate (LOPRESSOR) 50 MG tablet Take 1 tablet by mouth twice daily 15 tablet 0  . omeprazole (PRILOSEC) 40 MG capsule Take 1 capsule (40 mg total) by mouth daily. 30 capsule 3   No  facility-administered medications prior to visit.    No Known Allergies  Review of Systems  Constitutional: Positive for malaise/fatigue. Negative for fever.  HENT: Positive for congestion.   Eyes: Negative for blurred vision.  Respiratory: Positive for cough. Negative for shortness of breath.   Cardiovascular: Negative for chest pain, palpitations and leg swelling.  Gastrointestinal: Negative for abdominal pain, blood in stool and nausea.  Genitourinary: Negative for dysuria and frequency.  Musculoskeletal: Negative for falls.  Skin: Negative for rash.  Neurological: Negative for dizziness, loss of consciousness and headaches.  Endo/Heme/Allergies: Negative for environmental allergies.  Psychiatric/Behavioral: Negative for depression. The patient is not nervous/anxious.        Objective:    Physical Exam unable to obtain via phone  There were no vitals taken for this visit. Wt Readings from Last 3 Encounters:  04/23/18 167 lb (75.8 kg)  03/01/18 170 lb 6.4 oz (77.3 kg)  04/17/17 165 lb 3.2 oz (74.9 kg)    Diabetic Foot Exam - Simple   No data filed     Lab Results  Component Value Date   WBC 6.2 04/23/2018   HGB 12.9 04/23/2018   HCT 39.5 04/23/2018   PLT 331.0 04/23/2018   GLUCOSE 95 04/23/2018   CHOL 122 04/23/2018   TRIG 73.0 04/23/2018   HDL 39.40 04/23/2018   LDLCALC 68 04/23/2018   ALT 20 04/23/2018   AST 18 04/23/2018   NA 139 04/23/2018   K 4.2 04/23/2018   CL 105 04/23/2018   CREATININE 0.88 04/23/2018   BUN 10 04/23/2018   CO2 28 04/23/2018   TSH 1.05 03/21/2016   HGBA1C 5.9 04/23/2018    Lab Results  Component Value Date   TSH 1.05 03/21/2016   Lab Results  Component Value Date   WBC 6.2 04/23/2018   HGB 12.9 04/23/2018   HCT 39.5 04/23/2018   MCV 85.8 04/23/2018   PLT 331.0 04/23/2018   Lab Results  Component Value Date   NA 139 04/23/2018   K 4.2 04/23/2018   CO2 28 04/23/2018   GLUCOSE 95 04/23/2018   BUN 10 04/23/2018    CREATININE 0.88 04/23/2018   BILITOT 0.6 04/23/2018   ALKPHOS 63 04/23/2018   AST 18 04/23/2018   ALT 20 04/23/2018   PROT 6.8 04/23/2018   ALBUMIN 3.9 04/23/2018   CALCIUM 9.5 04/23/2018   ANIONGAP 9 03/17/2014   GFR 88.40 04/23/2018   Lab Results  Component Value Date   CHOL 122 04/23/2018   Lab Results  Component Value Date   HDL 39.40 04/23/2018   Lab Results  Component Value Date   LDLCALC 68 04/23/2018   Lab Results  Component Value Date   TRIG 73.0 04/23/2018  Lab Results  Component Value Date   CHOLHDL 3 04/23/2018   Lab Results  Component Value Date   HGBA1C 5.9 04/23/2018       Assessment & Plan:   Problem List Items Addressed This Visit    Essential hypertension, benign    Patient to monitor and report any concerns. no changes to meds. Encouraged heart healthy diet such as the DASH diet and exercise as tolerated.       Cough    Worsening but persistent for some time. Start Famotidine qhs and prescribe Tessalon perles for cough during the day and Hydromet for qhs. Encouraged increased rest and hydration, add probiotics, zinc such as Coldeze or Xicam. Treat fevers as needed. Report if no improvement.          I am having Terri [name] start on HYDROcodone-homatropine, benzonatate, and famotidine. I am also having her maintain her omeprazole, hydrochlorothiazide, and metoprolol tartrate.  Meds ordered this encounter  Medications  . HYDROcodone-homatropine (HYCODAN) 5-1.5 MG/5ML syrup    Sig: Take 5 mLs by mouth at bedtime as needed for cough.    Dispense:  100 mL    Refill:  0  . benzonatate (TESSALON) 100 MG capsule    Sig: Take 1 capsule (100 mg total) by mouth 2 (two) times daily as needed for cough.    Dispense:  20 capsule    Refill:  0  . famotidine (PEPCID) 40 MG tablet    Sig: Take 1 tablet (40 mg total) by mouth daily.    Dispense:  30 tablet    Refill:  0      I discussed the assessment and treatment plan with the patient.  The patient was provided an opportunity to ask questions and all were answered. The patient agreed with the plan and demonstrated an understanding of the instructions.   The patient was advised to call back or seek an in-person evaluation if the symptoms worsen or if the condition fails to improve as anticipated.  I provided 15 minutes of non-face-to-face time during this encounter.   Penni Homans, MD

## 2019-07-15 NOTE — Assessment & Plan Note (Addendum)
Patient to monitor and report any concerns. no changes to meds. Encouraged heart healthy diet such as the DASH diet and exercise as tolerated.

## 2019-07-22 ENCOUNTER — Telehealth: Payer: Self-pay

## 2019-07-22 DIAGNOSIS — I10 Essential (primary) hypertension: Secondary | ICD-10-CM

## 2019-07-22 MED ORDER — METOPROLOL TARTRATE 50 MG PO TABS: 50.0000 mg | ORAL_TABLET | Freq: Two times a day (BID) | ORAL | 0 refills | Status: DC

## 2019-07-22 NOTE — Telephone Encounter (Signed)
Copied from [place] [id number]. Topic: General - Other >> Jul 22, 2019 11:09 AM Leward Quan A wrote: Reason for CRM: Patient called to inquire of Dr Lorelei Pont can she refill her Rx for metoprolol tartrate (LOPRESSOR) 50 MG tablet per patient she has no health insurance but is in need of this medication. She does not have the money for an office visit right and owes for a visit she had on 07/09/2019 with Dr Charlett Blake. Patient is asking if Dr Lorelei Pont can please refill this medication for her. Asking for a call back at Ph# [phone]

## 2019-11-06 ENCOUNTER — Other Ambulatory Visit: Payer: Self-pay

## 2019-11-07 ENCOUNTER — Other Ambulatory Visit: Payer: Self-pay

## 2019-11-07 ENCOUNTER — Encounter: Payer: Self-pay | Admitting: Family Medicine

## 2019-11-07 ENCOUNTER — Ambulatory Visit (INDEPENDENT_AMBULATORY_CARE_PROVIDER_SITE_OTHER): Payer: Self-pay | Admitting: Family Medicine

## 2019-11-07 VITALS — BP 176/105 | HR 110 | Temp 97.0°F | Resp 17 | Ht 60.0 in | Wt 176.0 lb

## 2019-11-07 DIAGNOSIS — E559 Vitamin D deficiency, unspecified: Secondary | ICD-10-CM

## 2019-11-07 DIAGNOSIS — R7303 Prediabetes: Secondary | ICD-10-CM

## 2019-11-07 DIAGNOSIS — Z13220 Encounter for screening for lipoid disorders: Secondary | ICD-10-CM

## 2019-11-07 DIAGNOSIS — I10 Essential (primary) hypertension: Secondary | ICD-10-CM

## 2019-11-07 DIAGNOSIS — R7989 Other specified abnormal findings of blood chemistry: Secondary | ICD-10-CM

## 2019-11-07 DIAGNOSIS — Z1329 Encounter for screening for other suspected endocrine disorder: Secondary | ICD-10-CM

## 2019-11-07 MED ORDER — HYDROCHLOROTHIAZIDE 12.5 MG PO TABS: 12.5000 mg | ORAL_TABLET | Freq: Every day | ORAL | 3 refills | Status: DC

## 2019-11-07 MED ORDER — FAMOTIDINE 40 MG PO TABS: 40.0000 mg | ORAL_TABLET | Freq: Every day | ORAL | 0 refills | Status: DC

## 2019-11-07 MED ORDER — METOPROLOL TARTRATE 50 MG PO TABS: 50.0000 mg | ORAL_TABLET | Freq: Two times a day (BID) | ORAL | 0 refills | Status: DC

## 2019-11-07 NOTE — Patient Instructions (Signed)
It was good to see you today- I will be in touch with your labs asap  We will start you back on your BP meds and stomach medication at this time  Please contact me when you are ready for me to schedule colon cancer screening- ? Cologuard I would also like to get you a mammogram this year if possible  We can delay your pap if you like for now

## 2019-11-07 NOTE — Progress Notes (Addendum)
[facility] [address], [address] [place], [place] [address] [phone]  Date:  11/07/2019   Name:  Terri [name]   DOB:  February 08, 1971   MRN:  [id number]  PCP:  Pearline Cables, MD    Chief Complaint: Hypertension (med refill) and Vitamin D deficiency   History of Present Illness:  Terri [name] is a 49 y.o. very pleasant female patient who presents with the following:  Here today for medication follow-up-  Patient with history of hypertension, migraine headache, vitamin D deficiency, overweight She saw my partner Dr. Abner Greenspan in December for cough I last saw her in September 2019  Overdue for routine blood work- she is actually fasting today so will draw blood  Pap-patient declines today Covid series- not done yet, she is aware of how to arrange a vaccine when she is ready Colon cancer screening-Discussed with patient today mMammogram October 2019- she is due for routine screening She has been out of work for a while due to the pandemic- she is now working for a temp agency However she does not currently have health insurance, wishes to defer any nonessential services for the time being  Noted her blood pressure is high and heart rate is fast.  She is not taking her beta-blocker or HCTZ as she ran out She has noted mild headaches on and off, nothing severe.  No chest pain or shortness of breath  Patient Active Problem List   Diagnosis Date Noted  . Cough 07/15/2019  . Abnormal abdominal CT scan  see 2015 scan  referral to GYN made 08/03/2014  . Unspecified vitamin D deficiency 10/15/2012  . Essential hypertension, benign 09/24/2012  . History of migraine headaches 09/24/2012  . Overweight(278.02) 09/24/2012    Past Medical History:  Diagnosis Date  . Abnormal Pap smear of cervix    cryotherapy  . Hypertension   . Migraine     Past Surgical History:  Procedure Laterality Date  . LAPAROSCOPIC BILATERAL  SALPINGECTOMY Bilateral 03/18/2014   Procedure: LAPAROSCOPIC BILATERAL SALPINGECTOMY;  Surgeon: Allie Bossier, MD;  Location: [facility];  Service: Gynecology;  Laterality: Bilateral;  . LAPAROSCOPIC LYSIS OF ADHESIONS Bilateral 03/18/2014   Procedure: LAPAROSCOPIC LYSIS OF ADHESIONS;  Surgeon: Allie Bossier, MD;  Location: [facility];  Service: Gynecology;  Laterality: Bilateral;    Social History   Tobacco Use  . Smoking status: Never Smoker  . Smokeless tobacco: Never Used  Substance Use Topics  . Alcohol use: No  . Drug use: No    Family History  Problem Relation Age of Onset  . Hypertension Mother   . Cancer Maternal Aunt 50       colon Cancer  . Cancer Paternal Aunt 72       brest cancer  . Heart disease Maternal Grandmother   . Stroke Maternal Grandmother   . Stroke Paternal Grandmother     No Known Allergies  Medication list has been reviewed and updated.  No current outpatient medications on file prior to visit.   No current facility-administered medications on file prior to visit.    Review of Systems:  As per HPI- otherwise negative.   Physical Examination: Vitals:   11/07/19 1535 11/07/19 1537  BP: (!) 178/110 (!) 176/105  Pulse: (!) 105 (!) 110  Resp: 16 17  Temp: (!) 97.4 F (36.3 C) (!) 97 F (36.1 C)  SpO2: 97% 97%   Vitals:  11/07/19 1537  Weight: 176 lb (79.8 kg)  Height: 5' (1.524 m)   Body mass index is 34.37 kg/m. Ideal Body Weight: Weight in (lb) to have BMI = 25: 127.7  GEN: no acute distress.  Overweight, looks well  HEENT: Atraumatic, Normocephalic.  Ears and Nose: No external deformity. CV: RRR, No M/G/R. No JVD. No thrill. No extra heart sounds. PULM: CTA B, no wheezes, crackles, rhonchi. No retractions. No resp. distress. No accessory muscle use. ABD: S, NT, ND, +BS. No rebound. No HSM. EXTR: No c/c/e PSYCH: Normally interactive. Conversant.    BP Readings from Last 3 Encounters:  11/07/19 (!) 176/105  04/23/18 (!) 140/96   03/01/18 (!) 130/92   Pulse Readings from Last 3 Encounters:  11/07/19 (!) 110  04/23/18 90  03/01/18 74     Assessment and Plan: Essential hypertension - Plan: CBC, Comprehensive metabolic panel, hydrochlorothiazide (HYDRODIURIL) 12.5 MG tablet, metoprolol tartrate (LOPRESSOR) 50 MG tablet  Pre-diabetes - Plan: Comprehensive metabolic panel, Hemoglobin A1c  Screening for hyperlipidemia - Plan: Lipid panel  Vitamin D deficiency - Plan: Vitamin D (25 hydroxy)  Screening for thyroid disorder - Plan: TSH  Low TSH level - Plan: Ambulatory referral to Endocrinology, Thyroid antibodies, T4, free, CANCELED: T4, free, CANCELED: Thyroid antibodies  Here today for follow-up, she has been absent from care for a while as she lost her job during the pandemic Refilled her maintenance medications as above, encouraged her to get back on her blood pressure medications as soon as possible Labs pending as above, I will be in touch with his results Discussed Cologuard, I encouraged her to call exact sciences and inquire about cash price.  She agrees to do so Encouraged her to have a mammogram this year if at all possible Will plan further follow- up pending labs.  This visit occurred during the SARS-CoV-2 public health emergency.  Safety protocols were in place, including screening questions prior to the visit, additional usage of staff PPE, and extensive cleaning of exam room while observing appropriate contact time as indicated for disinfecting solutions.    Signed Lamar Blinks, MD  Addendum 4/12, received her labs as below  Called patient and discussed, I have contacted endocrinology to get advice and placed referral  Results for orders placed or performed in visit on 11/07/19  CBC  Result Value Ref Range   WBC 9.0 4.0 - 10.5 K/uL   RBC 4.41 3.87 - 5.11 Mil/uL   Platelets 279.0 150.0 - 400.0 K/uL   Hemoglobin 13.8 12.0 - 15.0 g/dL   HCT 41.2 36.0 - 46.0 %   MCV 93.2 78.0 - 100.0 fl    MCHC 33.5 30.0 - 36.0 g/dL   RDW 13.5 11.5 - 15.5 %  Comprehensive metabolic panel  Result Value Ref Range   Sodium 139 135 - 145 mEq/L   Potassium 3.9 3.5 - 5.1 mEq/L   Chloride 107 96 - 112 mEq/L   CO2 22 19 - 32 mEq/L   Glucose, Bld 90 70 - 99 mg/dL   BUN 11 6 - 23 mg/dL   Creatinine, Ser 0.84 0.40 - 1.20 mg/dL   Total Bilirubin 0.8 0.2 - 1.2 mg/dL   Alkaline Phosphatase 62 39 - 117 U/L   AST 16 0 - 37 U/L   ALT 13 0 - 35 U/L   Total Protein 6.7 6.0 - 8.3 g/dL   Albumin 4.2 3.5 - 5.2 g/dL   GFR 87.19 >60.00 mL/min   Calcium 9.4 8.4 - 10.5 mg/dL  Hemoglobin A1c  Result Value Ref Range   Hgb A1c MFr Bld 5.3 4.6 - 6.5 %  Lipid panel  Result Value Ref Range   Cholesterol 136 0 - 200 mg/dL   Triglycerides 54.6 0.0 - 149.0 mg/dL   HDL 27.03 >50.09 mg/dL   VLDL 9.8 0.0 - 38.1 mg/dL   LDL Cholesterol 85 0 - 99 mg/dL   Total CHOL/HDL Ratio 3    NonHDL 95.13   TSH  Result Value Ref Range   TSH <0.01 Repeated and verified X2. (L) 0.35 - 4.50 uIU/mL  Vitamin D (25 hydroxy)  Result Value Ref Range   VITD 76.99 30.00 - 100.00 ng/mL   Received message back from Dr [facility] from endocrinology- will add on labs as she suggested   Thank you for reaching out. I prefer to always have a free T4 level when the TSH is suppressed, prior to starting methimazole, to see if she is overtly hyperthyroid and what the baseline is, I would also check TRAb levels prior to treatment if possible.   If you believe her symptoms are hyperthyroid related , you can start on methimazole , personally I use the FT4 level to determine the dose. If FT4 is normal, I would start with 5 mg , if high I suggest starting 10 mg   Addendum 4/14  Received her free T4 and thyroid antibody levels  Free T4 is normal Will start on methimazole 5 mg.  call patient to discuss.  She confirms that she is not pregnant or breast-feeding She is okay with starting methimazole 5 mg Endocrinology referral is pending Invited  all questions, discussed likely diagnosis of autoimmune hyperthyroidism such as Graves' disease  Free T4 0.8 - 1.8 ng/dL 1.8   Resulting Agency  [facility]      Specimen Collected: 11/12/19 09:05 Last Resulted: 11/13/19 17:50      Lab Flowsheet    Order Details    View Encounter    Lab and Collection Details    Routing    Result History         Contains abnormal data Thyroid antibodies Order: [id number] Status:  Final result Visible to patient:  No (scheduled for 11/13/2019 6:51 PM) Dx:  Low TSH level  Ref Range & Units 1 d ago  Thyroglobulin Ab < or = 1 IU/mL <1   Thyroperoxidase Ab SerPl-aCnc <9 IU/mL 42High

## 2019-11-08 LAB — COMPREHENSIVE METABOLIC PANEL
ALT: 13 U/L (ref 0–35)
AST: 16 U/L (ref 0–37)
Albumin: 4.2 g/dL (ref 3.5–5.2)
Alkaline Phosphatase: 62 U/L (ref 39–117)
BUN: 11 mg/dL (ref 6–23)
CO2: 22 mEq/L (ref 19–32)
Calcium: 9.4 mg/dL (ref 8.4–10.5)
Chloride: 107 mEq/L (ref 96–112)
Creatinine, Ser: 0.84 mg/dL (ref 0.40–1.20)
GFR: 87.19 mL/min (ref >60.00)
Glucose, Bld: 90 mg/dL (ref 70–99)
Potassium: 3.9 mEq/L (ref 3.5–5.1)
Sodium: 139 mEq/L (ref 135–145)
Total Bilirubin: 0.8 mg/dL (ref 0.2–1.2)
Total Protein: 6.7 g/dL (ref 6.0–8.3)

## 2019-11-08 LAB — CBC
HCT: 41.2 % (ref 36.0–46.0)
Hemoglobin: 13.8 g/dL (ref 12.0–15.0)
MCHC: 33.5 g/dL (ref 30.0–36.0)
MCV: 93.2 fl (ref 78.0–100.0)
Platelets: 279 10*3/uL (ref 150.0–400.0)
RBC: 4.41 Mil/uL (ref 3.87–5.11)
RDW: 13.5 % (ref 11.5–15.5)
WBC: 9 10*3/uL (ref 4.0–10.5)

## 2019-11-08 LAB — HEMOGLOBIN A1C: Hgb A1c MFr Bld: 5.3 % (ref 4.6–6.5)

## 2019-11-08 LAB — LIPID PANEL
Cholesterol: 136 mg/dL (ref 0–200)
HDL: 40.7 mg/dL (ref >39.00)
LDL Cholesterol: 85 mg/dL (ref 0–99)
NonHDL: 95.13
Total CHOL/HDL Ratio: 3
Triglycerides: 49 mg/dL (ref 0.0–149.0)
VLDL: 9.8 mg/dL (ref 0.0–40.0)

## 2019-11-08 LAB — VITAMIN D 25 HYDROXY (VIT D DEFICIENCY, FRACTURES): VITD: 76.99 ng/mL (ref 30.00–100.00)

## 2019-11-08 LAB — TSH: TSH: <0.01 u[IU]/mL — ABNORMAL LOW (ref 0.35–4.50)

## 2019-11-11 NOTE — Addendum Note (Signed)
Addended by: Abbe Amsterdam C on: 11/11/2019 03:46 PM   Modules accepted: Orders

## 2019-11-11 NOTE — Addendum Note (Signed)
Addended by: Abbe Amsterdam C on: 11/11/2019 01:06 PM   Modules accepted: Orders

## 2019-11-12 ENCOUNTER — Other Ambulatory Visit: Payer: Self-pay

## 2019-11-12 DIAGNOSIS — R7989 Other specified abnormal findings of blood chemistry: Secondary | ICD-10-CM

## 2019-11-12 NOTE — Addendum Note (Signed)
Addended by: Mervin Kung A on: 11/12/2019 08:41 AM   Modules accepted: Orders

## 2019-11-12 NOTE — Addendum Note (Signed)
Addended by: Mervin Kung A on: 11/12/2019 09:04 AM   Modules accepted: Orders

## 2019-11-13 LAB — THYROID ANTIBODIES
Thyroglobulin Ab: <1 IU/mL (ref <=1)
Thyroperoxidase Ab SerPl-aCnc: 42 IU/mL — ABNORMAL HIGH (ref <9)

## 2019-11-13 LAB — T4, FREE: Free T4: 1.8 ng/dL (ref 0.8–1.8)

## 2019-11-13 MED ORDER — METHIMAZOLE 5 MG PO TABS: 5.0000 mg | ORAL_TABLET | Freq: Every day | ORAL | 2 refills | Status: DC

## 2019-11-13 NOTE — Addendum Note (Signed)
Addended by: Abbe Amsterdam C on: 11/13/2019 06:02 PM   Modules accepted: Orders

## 2019-12-19 ENCOUNTER — Other Ambulatory Visit: Payer: Self-pay | Admitting: Family Medicine

## 2019-12-19 DIAGNOSIS — I10 Essential (primary) hypertension: Secondary | ICD-10-CM

## 2019-12-31 ENCOUNTER — Other Ambulatory Visit: Payer: Self-pay | Admitting: Family Medicine

## 2020-01-03 ENCOUNTER — Other Ambulatory Visit: Payer: Self-pay

## 2020-01-07 ENCOUNTER — Encounter: Payer: Self-pay | Admitting: Internal Medicine

## 2020-01-07 ENCOUNTER — Ambulatory Visit (INDEPENDENT_AMBULATORY_CARE_PROVIDER_SITE_OTHER): Payer: Self-pay | Admitting: Internal Medicine

## 2020-01-07 ENCOUNTER — Other Ambulatory Visit: Payer: Self-pay

## 2020-01-07 ENCOUNTER — Telehealth: Payer: Self-pay | Admitting: Internal Medicine

## 2020-01-07 VITALS — BP 142/88 | HR 67 | Temp 99.0°F | Ht 60.0 in | Wt 175.2 lb

## 2020-01-07 DIAGNOSIS — E0590 Thyrotoxicosis, unspecified without thyrotoxic crisis or storm: Secondary | ICD-10-CM

## 2020-01-07 LAB — TSH: TSH: 0.07 u[IU]/mL — ABNORMAL LOW (ref 0.35–4.50)

## 2020-01-07 LAB — T4, FREE: Free T4: 0.73 ng/dL (ref 0.60–1.60)

## 2020-01-07 MED ORDER — METHIMAZOLE 5 MG PO TABS: 5.0000 mg | ORAL_TABLET | Freq: Every day | ORAL | 6 refills | Status: DC

## 2020-01-07 NOTE — Telephone Encounter (Signed)
Pt aware.

## 2020-01-07 NOTE — Telephone Encounter (Signed)
Please let her know that her thyroid is going in the right direction and I suggest continuing the current dose of methimazole 5 mg, at 1 tablet daily    Thank you   Abby Raelyn Mora, MD  [facility] Endocrinology  [facility] [address] [place], [place] [address] Phone: [phone] FAX: [phone]

## 2020-01-07 NOTE — Progress Notes (Signed)
Name: Terri [name]  MRN/ DOB: [id number], 10/16/1970    Age/ Sex: 49 y.o., female    PCP: Copland, Gwenlyn Found, MD   Reason for Endocrinology Evaluation: Subclinical hyperthyroidism      Date of Initial Endocrinology Evaluation: 01/07/2020     HPI: [name] is a 49 y.o. female with a past medical history of HTN. The patient presented for initial endocrinology clinic visit on 01/07/2020 for consultative assistance with her Subclinical hyperthyroidism.    Pt has been noted to have a suppressed TSH in 10/2019 at < 0.01 uIU/mL  with normal FT4, during routine work up.   She has been started on Methimazole in 10/2019 due to symptoms of hyperthyroidism.   She did have weight loss at the time, and was noted with tachycardia and occasional diarrhea  She is tolerating methimazole well  Has noted stable weight. Denies diarrhea and palpitations Has chronic cough for ~ 1 yr that is attributed to GERD   No FH of thyroid disease   No biotin use No prior exospore to radiation.      HISTORY:  Past Medical History:  Past Medical History:  Diagnosis Date  . Abnormal Pap smear of cervix    cryotherapy  . Hypertension   . Migraine    Past Surgical History:  Past Surgical History:  Procedure Laterality Date  . LAPAROSCOPIC BILATERAL SALPINGECTOMY Bilateral 03/18/2014   Procedure: LAPAROSCOPIC BILATERAL SALPINGECTOMY;  Surgeon: Allie Bossier, MD;  Location: [facility];  Service: Gynecology;  Laterality: Bilateral;  . LAPAROSCOPIC LYSIS OF ADHESIONS Bilateral 03/18/2014   Procedure: LAPAROSCOPIC LYSIS OF ADHESIONS;  Surgeon: Allie Bossier, MD;  Location: [facility];  Service: Gynecology;  Laterality: Bilateral;      Social History:  reports that she has never smoked. She has never used smokeless tobacco. She reports that she does not drink alcohol or use drugs.  Family History: family history includes Cancer (age of onset: 78) in her maternal aunt and paternal aunt; Heart disease in  her maternal grandmother; Hypertension in her mother; Stroke in her maternal grandmother and paternal grandmother.   HOME MEDICATIONS: Allergies as of 01/07/2020   No Known Allergies     Medication List       Accurate as of January 07, 2020  1:43 PM. If you have any questions, ask your nurse or doctor.        STOP taking these medications   hydrochlorothiazide 12.5 MG tablet Commonly known as: HYDRODIURIL Stopped by: Scarlette Shorts, MD     TAKE these medications   famotidine 40 MG tablet Commonly known as: PEPCID Take 1 tablet by mouth once daily   methimazole 5 MG tablet Commonly known as: TAPAZOLE Take 1 tablet (5 mg total) by mouth daily.   metoprolol tartrate 50 MG tablet Commonly known as: LOPRESSOR Take 1 tablet by mouth twice daily         REVIEW OF SYSTEMS: A comprehensive ROS was conducted with the patient and is negative except as per HPI and below:     OBJECTIVE:  VS: BP (!) 142/88 (BP Location: Left Arm, Patient Position: Sitting, Cuff Size: Normal)   Pulse 67   Temp 99 F (37.2 C)   Ht 5' (1.524 m)   Wt 175 lb 3.2 oz (79.5 kg)   LMP 12/31/2019 (Exact Date)   SpO2 98%   BMI 34.22 kg/m    Wt Readings from Last 3 Encounters:  01/07/20 175 lb 3.2  oz (79.5 kg)  11/07/19 176 lb (79.8 kg)  04/23/18 167 lb (75.8 kg)     EXAM: General: Pt appears well and is in NAD  Eyes: External eye exam normal with left eye stare,but no  lid lag or exophthalmos.  EOM intact.    Neck: General: Supple without adenopathy. Thyroid: Thyroid size normal.  No goiter or nodules appreciated. No thyroid bruit.  Lungs: Clear with good BS bilat with no rales, rhonchi, or wheezes  Heart: Auscultation: RRR.  Abdomen: Normoactive bowel sounds, soft, nontender, without masses or organomegaly palpable  Extremities:  BL LE: No pretibial edema normal ROM and strength.  Skin: Hair: Texture and amount normal with gender appropriate distribution Skin Inspection: No  rashes Skin Palpation: Skin temperature, texture, and thickness normal to palpation  Neuro: Cranial nerves: II - XII grossly intact  Motor: Normal strength throughout DTRs: 2+ and symmetric in UE without delay in relaxation phase  Mental Status: Judgment, insight: Intact Orientation: Oriented to time, place, and person Memory: Intact for recent and remote events Mood and affect: No depression, anxiety, or agitation     DATA REVIEWED:   Results for KAMRY, FARACI (MRN [id number]) as of 01/07/2020 13:48  Ref. Range 11/07/2019 15:50 11/12/2019 09:05 01/07/2020 08:30  TSH Latest Ref Range: 0.35 - 4.50 uIU/mL <0.01 Repeated and verified X2. (L)  0.07 (L)  T4,Free(Direct) Latest Ref Range: 0.60 - 1.60 ng/dL  1.8 0.73  Thyroglobulin Ab Latest Ref Range: < or = 1 IU/mL  <1   Thyroperoxidase Ab SerPl-aCnc Latest Ref Range: <9 IU/mL  42 (H)     ASSESSMENT/PLAN/RECOMMENDATIONS:   1. Subclinical hyperthyroidism :   - Pt is clinically euthyroid - No local neck symptoms - Tolerating methimazole well - Most likely secondary to graves' disease ( given elevated Anti-TPO Ab's) but we also discussed autonomous thyroid nodule(s) and subacute thyroiditis as a differential  - FT4 Is trending down, TSH is lagging behind  - NO changes today    Medications :  Methimazole 5 mg daily     F/U in 3 months   Signed electronically by: Mack Guise, MD  [facility]  [facility] [address] [place], [place] [address] Phone: [phone] FAX: [phone]   CC: [address], [place] [place] [place] [address] [place] [address] Phone: [phone] Fax: [phone]   Return to Endocrinology clinic as below: No future appointments.

## 2020-01-07 NOTE — Patient Instructions (Signed)
-   Continue Methimazole 5 mg daily - until you hear otherwise from us.  

## 2020-01-07 NOTE — Telephone Encounter (Signed)
Pt asked if she could continue to exercise? She stated that it was discussed during visit and that you had told her that you wanted to get her results back before advising.

## 2020-01-10 LAB — TRAB (TSH RECEPTOR BINDING ANTIBODY): TRAB: 8.93 IU/L — ABNORMAL HIGH (ref <=2.00)

## 2020-01-14 ENCOUNTER — Encounter: Payer: Self-pay | Admitting: Internal Medicine

## 2020-01-31 ENCOUNTER — Ambulatory Visit: Payer: Self-pay | Admitting: Internal Medicine

## 2020-02-07 ENCOUNTER — Ambulatory Visit: Payer: Self-pay | Admitting: Internal Medicine

## 2020-02-12 ENCOUNTER — Telehealth (INDEPENDENT_AMBULATORY_CARE_PROVIDER_SITE_OTHER): Payer: Self-pay | Admitting: Family Medicine

## 2020-02-12 ENCOUNTER — Other Ambulatory Visit: Payer: Self-pay | Admitting: Family Medicine

## 2020-02-12 ENCOUNTER — Other Ambulatory Visit (HOSPITAL_COMMUNITY)
Admission: RE | Admit: 2020-02-12 | Discharge: 2020-02-12 | Disposition: A | Discharge status: Home / Self Care | Payer: Self-pay | Source: Ambulatory Visit | Attending: Family Medicine | Admitting: Family Medicine

## 2020-02-12 ENCOUNTER — Other Ambulatory Visit: Payer: Self-pay

## 2020-02-12 ENCOUNTER — Encounter: Payer: Self-pay | Admitting: Family Medicine

## 2020-02-12 DIAGNOSIS — R82998 Other abnormal findings in urine: Secondary | ICD-10-CM

## 2020-02-12 DIAGNOSIS — N760 Acute vaginitis: Secondary | ICD-10-CM | POA: Insufficient documentation

## 2020-02-12 LAB — POC URINALSYSI DIPSTICK (AUTOMATED)
Bilirubin, UA: NEGATIVE
Blood, UA: NEGATIVE
Glucose, UA: NEGATIVE
Ketones, UA: NEGATIVE
Nitrite, UA: NEGATIVE
Protein, UA: NEGATIVE
Spec Grav, UA: 1.015 (ref 1.010–1.025)
Urobilinogen, UA: 0.2 E.U./dL
pH, UA: 6 (ref 5.0–8.0)

## 2020-02-12 LAB — POCT URINE PREGNANCY: Preg Test, Ur: NEGATIVE

## 2020-02-12 MED ORDER — FLUCONAZOLE 150 MG PO TABS: ORAL_TABLET | ORAL | 0 refills | Status: DC

## 2020-02-12 NOTE — Progress Notes (Signed)
Virtual Visit via Video Note  I connected with Terri [name] on 02/12/20 at  9:00 AM EDT by a video enabled telemedicine application and verified that I am speaking with the correct person using two identifiers.  Location: Patient: home alone Provider: home    I discussed the limitations of evaluation and management by telemedicine and the availability of in person appointments. The patient expressed understanding and agreed to proceed.  History of Present Illness: Pt is home and c/o irritation and d/c in vaginal area  x several days Observations/Objective: There were no vitals filed for this visit. Pt is in nad  Assessment and Plan: 1. Acute vaginitis Check labs Diflucan sent  F/u prn  - fluconazole (DIFLUCAN) 150 MG tablet; 1 po x1, may repeat in 3 days prn  Dispense: 2 tablet; Refill: 0 - POCT Urinalysis Dipstick (Automated); Future - POCT urine pregnancy; Future - Urine cytology ancillary only([place]); Future - Urine cytology ancillary only([place]) - POCT urine pregnancy - POCT Urinalysis Dipstick (Automated) - Urine Culture  2. Leukocytes in urine Check culture  - Urine Culture   Follow Up Instructions:    I discussed the assessment and treatment plan with the patient. The patient was provided an opportunity to ask questions and all were answered. The patient agreed with the plan and demonstrated an understanding of the instructions.   The patient was advised to call back or seek an in-person evaluation if the symptoms worsen or if the condition fails to improve as anticipated.  I provided 30 minutes of non-face-to-face time during this encounter.   Donato Schultz, DO

## 2020-02-13 ENCOUNTER — Telehealth: Payer: Self-pay | Admitting: Family Medicine

## 2020-02-13 NOTE — Telephone Encounter (Signed)
Called pt back. Informed her that culture was not back yet.

## 2020-02-13 NOTE — Telephone Encounter (Signed)
Patient is requesting a Call back in regards to lab results of urine sample given yesterday.

## 2020-02-14 ENCOUNTER — Other Ambulatory Visit: Payer: Self-pay

## 2020-02-14 LAB — URINE CULTURE
MICRO NUMBER:: 10704042
SPECIMEN QUALITY:: ADEQUATE

## 2020-02-14 LAB — URINE CYTOLOGY ANCILLARY ONLY
Bacterial Vaginitis-Urine: NEGATIVE
Candida Urine: NEGATIVE — AB
Candida Urine: POSITIVE — AB
Chlamydia: NEGATIVE
Comment: NEGATIVE
Comment: NEGATIVE
Comment: NORMAL
Neisseria Gonorrhea: NEGATIVE
Trichomonas: NEGATIVE

## 2020-02-14 MED ORDER — CEPHALEXIN 500 MG PO CAPS
500.0000 mg | ORAL_CAPSULE | Freq: Two times a day (BID) | ORAL | 0 refills | Status: DC
Start: 2020-02-14 — End: 2020-12-11

## 2020-02-14 NOTE — Telephone Encounter (Signed)
Pt called back. Informed of results. Med sent to pharmacy

## 2020-02-14 NOTE — Telephone Encounter (Signed)
Patient is requesting a call back for lab results. 

## 2020-05-25 ENCOUNTER — Emergency Department (HOSPITAL_BASED_OUTPATIENT_CLINIC_OR_DEPARTMENT_OTHER): Admission: EM | Admit: 2020-05-25 | Discharge: 2020-05-25 | Discharge status: Against Medical Advice | Payer: Self-pay

## 2020-05-25 ENCOUNTER — Other Ambulatory Visit: Payer: Self-pay

## 2020-06-23 ENCOUNTER — Other Ambulatory Visit: Payer: Self-pay | Admitting: Family Medicine

## 2020-06-23 DIAGNOSIS — I10 Essential (primary) hypertension: Secondary | ICD-10-CM

## 2020-09-14 ENCOUNTER — Other Ambulatory Visit: Payer: Self-pay | Admitting: Internal Medicine

## 2020-10-23 ENCOUNTER — Other Ambulatory Visit: Payer: Self-pay | Admitting: Internal Medicine

## 2020-10-23 MED ORDER — METHIMAZOLE 5 MG PO TABS: 5.0000 mg | ORAL_TABLET | Freq: Every day | ORAL | 2 refills | Status: DC

## 2020-12-11 ENCOUNTER — Ambulatory Visit: Payer: 59 | Admitting: Medical

## 2020-12-11 ENCOUNTER — Other Ambulatory Visit: Payer: Self-pay

## 2020-12-11 ENCOUNTER — Ambulatory Visit (HOSPITAL_BASED_OUTPATIENT_CLINIC_OR_DEPARTMENT_OTHER)
Admission: RE | Admit: 2020-12-11 | Discharge: 2020-12-11 | Disposition: A | Discharge status: Home / Self Care | Payer: 59 | Source: Ambulatory Visit | Attending: Medical | Admitting: Medical

## 2020-12-11 ENCOUNTER — Ambulatory Visit: Payer: Self-pay | Admitting: Family Medicine

## 2020-12-11 VITALS — BP 170/100 | HR 100 | Temp 98.8°F | Resp 20 | Ht 60.0 in | Wt 146.0 lb

## 2020-12-11 DIAGNOSIS — K219 Gastro-esophageal reflux disease without esophagitis: Secondary | ICD-10-CM

## 2020-12-11 DIAGNOSIS — M25551 Pain in right hip: Secondary | ICD-10-CM | POA: Diagnosis not present

## 2020-12-11 DIAGNOSIS — R1013 Epigastric pain: Secondary | ICD-10-CM | POA: Diagnosis not present

## 2020-12-11 DIAGNOSIS — Z1211 Encounter for screening for malignant neoplasm of colon: Secondary | ICD-10-CM

## 2020-12-11 DIAGNOSIS — R1011 Right upper quadrant pain: Secondary | ICD-10-CM | POA: Diagnosis not present

## 2020-12-11 DIAGNOSIS — G8929 Other chronic pain: Secondary | ICD-10-CM | POA: Insufficient documentation

## 2020-12-11 DIAGNOSIS — I10 Essential (primary) hypertension: Secondary | ICD-10-CM

## 2020-12-11 DIAGNOSIS — R059 Cough, unspecified: Secondary | ICD-10-CM | POA: Diagnosis not present

## 2020-12-11 LAB — CBC WITH DIFFERENTIAL/PLATELET
Basophils Absolute: 0 10*3/uL (ref 0.0–0.1)
Basophils Relative: 0.3 % (ref 0.0–3.0)
Eosinophils Absolute: 0.1 10*3/uL (ref 0.0–0.7)
Eosinophils Relative: 1.2 % (ref 0.0–5.0)
HCT: 35.8 % — ABNORMAL LOW (ref 36.0–46.0)
Hemoglobin: 11.2 g/dL — ABNORMAL LOW (ref 12.0–15.0)
Lymphocytes Relative: 29.3 % (ref 12.0–46.0)
Lymphs Abs: 2 10*3/uL (ref 0.7–4.0)
MCHC: 31.4 g/dL (ref 30.0–36.0)
MCV: 74.7 fl — ABNORMAL LOW (ref 78.0–100.0)
Monocytes Absolute: 0.8 10*3/uL (ref 0.1–1.0)
Monocytes Relative: 12.3 % — ABNORMAL HIGH (ref 3.0–12.0)
Neutro Abs: 3.9 10*3/uL (ref 1.4–7.7)
Neutrophils Relative %: 56.9 % (ref 43.0–77.0)
Platelets: 298 10*3/uL (ref 150.0–400.0)
RBC: 4.79 Mil/uL (ref 3.87–5.11)
RDW: 19.4 % — ABNORMAL HIGH (ref 11.5–15.5)
WBC: 6.8 10*3/uL (ref 4.0–10.5)

## 2020-12-11 LAB — COMPREHENSIVE METABOLIC PANEL
ALT: 21 U/L (ref 0–35)
AST: 18 U/L (ref 0–37)
Albumin: 3.8 g/dL (ref 3.5–5.2)
Alkaline Phosphatase: 75 U/L (ref 39–117)
BUN: 12 mg/dL (ref 6–23)
CO2: 26 mEq/L (ref 19–32)
Calcium: 9.1 mg/dL (ref 8.4–10.5)
Chloride: 108 mEq/L (ref 96–112)
Creatinine, Ser: 0.72 mg/dL (ref 0.40–1.20)
GFR: 97.71 mL/min (ref >60.00)
Glucose, Bld: 88 mg/dL (ref 70–99)
Potassium: 3.9 mEq/L (ref 3.5–5.1)
Sodium: 141 mEq/L (ref 135–145)
Total Bilirubin: 0.6 mg/dL (ref 0.2–1.2)
Total Protein: 6.4 g/dL (ref 6.0–8.3)

## 2020-12-11 LAB — LIPASE: Lipase: 15 U/L (ref 11.0–59.0)

## 2020-12-11 MED ORDER — AMLODIPINE BESYLATE 5 MG PO TABS: 5.0000 mg | ORAL_TABLET | Freq: Every day | ORAL | 3 refills | Status: DC

## 2020-12-11 MED ORDER — FAMOTIDINE 20 MG PO TABS: 20.0000 mg | ORAL_TABLET | Freq: Two times a day (BID) | ORAL | 3 refills | Status: DC

## 2020-12-11 NOTE — Patient Instructions (Signed)
History of some suspicious signs/symptoms GERD with dry cough.  Also some recent right upper quadrant pain.  Do want you to start taking famotidine daily.  Prescription given today.  Will get CBC, CMP, lipase and place order for abdominal ultrasound.  Went ahead and referred to gastroenterologist for probable endoscopy.  Also you are due for screening colonoscopy.  History of hypertension and blood pressure elevated today but you are not on your medication.  Please take metoprolol when you get home.  Since her blood pressure runs systolically high and is quite high today decided to go ahead and add amlodipine 5 mg to your regimen.  Recent right hip pain.  None presently on exam.  Will get right hip x-ray and presently advise using Tylenol.  Not to use NSAIDs presently since blood pressure high.  If your blood pressure were to come down to 130-140/80-90 then could use low-dose ibuprofen.  Follow-up in 10 days or as needed.

## 2020-12-11 NOTE — Progress Notes (Signed)
Subjective:    Patient ID: Terri [name], female    DOB: 1971-05-30, 50 y.o.   MRN: [id number]  HPI  Pt in for cough and hip pain.  Cough present for 3-5 years. Pt states historically has mostly been a dry cough. Pt states work up in past showed negative for h pylori. Pt states in past given tablet and liquid. She responded to this and around that time told may need endoscopy. Pt does take famotadine about once every 3 weeks. Pt states some recent belching. Pt states at time upper throat region sour taste. No wheezing, no sob, no description of allergy signs or symptoms.  Recent rt upper quadrant dull pain at night. Present for about one week.   Pt also report rt hip pain intermittent when she stands. She states at time the pain can be severe. Pain daily for past 8 months. Overall 2 years was only random low level pain.  Htn- pt states did not take med today. No cardiac or neurologic signs or symptoms. Pt stopped hctz one year ago due to urinating too frequently.     Review of Systems  Constitutional: Negative for chills, fatigue and fever.  Respiratory: Positive for cough. Negative for chest tightness, wheezing and stridor.        2018 chest x-ray negative.  Cardiovascular: Negative for chest pain and palpitations.  Gastrointestinal: Positive for abdominal pain. Negative for abdominal distention, constipation, diarrhea, nausea and vomiting.  Musculoskeletal: Negative for back pain.       Right hip pain.  Skin: Negative for rash.  Neurological: Negative for dizziness, weakness, light-headedness and headaches.  Hematological: Negative for adenopathy. Does not bruise/bleed easily.  Psychiatric/Behavioral: Negative for confusion.       Objective:   Physical Exam  General Mental Status- Alert. General Appearance- Not in acute distress.   Skin General: Color- Normal Color. Moisture- Normal Moisture.  Neck Carotid Arteries- Normal color. Moisture- Normal Moisture. No carotid  bruits. No JVD.  Chest and Lung Exam Auscultation: Breath Sounds:-Normal.  Cardiovascular Auscultation:Rythm- Regular. Murmurs & Other Heart Sounds:Auscultation of the heart reveals- No Murmurs.  Abdomen Inspection:-Inspeection Normal. Palpation/Percussion:Note:No mass. Palpation and Percussion of the abdomen reveal- Non Tender, Non Distended + BS, no rebound or guarding.    Neurologic Cranial Nerve exam:- CN III-XII intact(No nystagmus), symmetric smile. Strength:- 5/5 equal and symmetric strength both upper and lower extremities.  Right hip- no pain on range of motion presently.  No pain on palpation.     Assessment & Plan:  History of some suspicious signs/symptoms GERD with dry cough.  Also some recent right upper quadrant pain.  Do want you to start taking famotidine daily.  Prescription given today.  Will get CBC, CMP, lipase and place order for abdominal ultrasound.  Went ahead and referred to gastroenterologist for probable endoscopy.  Also you are due for screening colonoscopy.  History of hypertension and blood pressure elevated today but you are not on your medication.  Please take metoprolol when you get home.  Since her blood pressure runs systolically high and is quite high today decided to go ahead and add amlodipine 5 mg to your regimen.  Recent right hip pain.  None presently on exam.  Will get right hip x-ray and presently advise using Tylenol.  Not to use NSAIDs presently since blood pressure high.  If your blood pressure were to come down to 130-140/80-90 then could use low-dose ibuprofen.  Follow-up in 10 days or as needed.  Esperanza Richters,  PA-C

## 2020-12-23 ENCOUNTER — Other Ambulatory Visit: Payer: Self-pay | Admitting: Family Medicine

## 2020-12-23 DIAGNOSIS — I10 Essential (primary) hypertension: Secondary | ICD-10-CM

## 2021-01-05 ENCOUNTER — Ambulatory Visit (HOSPITAL_BASED_OUTPATIENT_CLINIC_OR_DEPARTMENT_OTHER)
Admission: RE | Admit: 2021-01-05 | Discharge: 2021-01-05 | Disposition: A | Discharge status: Home / Self Care | Payer: 59 | Source: Ambulatory Visit | Attending: Medical | Admitting: Medical

## 2021-01-05 ENCOUNTER — Other Ambulatory Visit: Payer: Self-pay

## 2021-01-05 ENCOUNTER — Ambulatory Visit: Payer: 59 | Admitting: Physician Assistant

## 2021-01-05 DIAGNOSIS — R1013 Epigastric pain: Secondary | ICD-10-CM | POA: Insufficient documentation

## 2021-01-05 DIAGNOSIS — R1011 Right upper quadrant pain: Secondary | ICD-10-CM | POA: Insufficient documentation

## 2021-01-07 ENCOUNTER — Other Ambulatory Visit: Payer: Self-pay | Admitting: Family Medicine

## 2021-01-07 DIAGNOSIS — I10 Essential (primary) hypertension: Secondary | ICD-10-CM

## 2021-01-18 ENCOUNTER — Telehealth: Payer: Self-pay

## 2021-01-18 NOTE — Telephone Encounter (Signed)
Pt called asking for further explanation on her [place] results and what the terminology means. She asked to be called after work- after 3:30 pm,  Callback number: [phone]

## 2021-01-18 NOTE — Telephone Encounter (Signed)
Called patient , she wanted more info , scheduled her a VV for 01/19/21

## 2021-01-19 ENCOUNTER — Telehealth: Payer: Self-pay | Admitting: Medical

## 2021-01-19 ENCOUNTER — Other Ambulatory Visit: Payer: Self-pay

## 2021-01-19 ENCOUNTER — Telehealth (INDEPENDENT_AMBULATORY_CARE_PROVIDER_SITE_OTHER): Payer: 59 | Admitting: Medical

## 2021-01-19 DIAGNOSIS — R634 Abnormal weight loss: Secondary | ICD-10-CM

## 2021-01-19 DIAGNOSIS — K219 Gastro-esophageal reflux disease without esophagitis: Secondary | ICD-10-CM | POA: Diagnosis not present

## 2021-01-19 DIAGNOSIS — E0590 Thyrotoxicosis, unspecified without thyrotoxic crisis or storm: Secondary | ICD-10-CM

## 2021-01-19 DIAGNOSIS — Z1211 Encounter for screening for malignant neoplasm of colon: Secondary | ICD-10-CM

## 2021-01-19 DIAGNOSIS — Z1231 Encounter for screening mammogram for malignant neoplasm of breast: Secondary | ICD-10-CM

## 2021-01-19 NOTE — Patient Instructions (Signed)
Your symptoms of reflux have improved recently with famotidine twice daily.  But with your history of chronic intermittent symptoms in the past you may benefit from endoscopy.  Also you are due for screening colonoscopy and had questions on recent ultrasound.  We discussed imaging studies today.  Placed referral to GI again as you do agree to follow through.     History of Graves' disease.  Low TSH and normal T4 12-2019.  With a 30 pound weight loss over the last year we will go ahead and send message to your endocrinologist regarding follow-up visit.  On review you are not up-to-date on your screening mammogram so went ahead and placed that order.  Follow-up as regular scheduled with PCP or as needed.

## 2021-01-19 NOTE — Telephone Encounter (Signed)
Dr. Lonzo Cloud,  Pt has history of sublinical hyperthyroid. On Tapazole. Last tsh low one year ago and nml t4. Pt has lost 30 lb in last year per her report.  Wanted to update you on this. Do want to see her in near future. Or if you want me to order tsh and t4 can arrange for her to get that done. Today visit with her was video visit for follow up abdomen pain and she brought up wt loss and thyroid.  If was in person would have gotten the labs.  Thanks,  Ramon Dredge

## 2021-01-19 NOTE — Telephone Encounter (Signed)
Opened to review 

## 2021-01-19 NOTE — Progress Notes (Signed)
   Subjective:    Patient ID: Terri [name], female    DOB: Jan 22, 1971, 50 y.o.   MRN: [id number]  HPI  Virtual Visit via Video Note  I connected with Terri [name] on 01/19/21 at  4:20 PM EDT by a video enabled telemedicine application and verified that I am speaking with the correct person using two identifiers.  Location: Patient: home Provider: office   I discussed the limitations of evaluation and management by telemedicine and the availability of in person appointments. The patient expressed understanding and agreed to proceed.  History of Present Illness:   Pt in states her gi symptoms have resolved. Seemed to resolved with famotadine.  I placed referral for screening colonoscopy and possible endoscopy based on patient's history.  Looks like GI called her but then she canceled appointment.    Patient had questions about her ultrasound report which showed the below in "  "IMPRESSION: Negative for gallstones. No acute abnormality or finding to explain the patient's symptoms.   Nonspecific lesion in the left upper quadrant the abdomen is unchanged since 2015 and consistent with a benign entity such as an old hematoma or ganglion cyst."  She also had questions on her prior CT abdomen done in 2015.    Weight loss of about 30 lbs in about 6 months. Pt has graves disease. Low tsh one year ago and t4 was normal.  On review of last specialist visit with endocrinologist her weight was stable.  Not up to date mammogram   Observations/Objective: General-no acute distress, pleasant, oriented. Lungs- on inspection lungs appear unlabored. Neck- no tracheal deviation or jvd on inspection. Neuro- gross motor function appears intact.   Assessment and Plan: Your symptoms of reflux have improved recently with famotidine twice daily.  But with your history of chronic intermittent symptoms in the past you may benefit from endoscopy.  Also you are due for screening colonoscopy and  had questions on recent ultrasound.  We discussed imaging studies today.  Placed referral to GI again as you do agree to follow through.     History of Graves' disease.  Low TSH and normal T4 12-2019.  With a 30 pound weight loss over the last year we will go ahead and send message to your endocrinologist regarding follow-up visit.  On review you are not up-to-date on your screening mammogram so went ahead and placed that order.  Follow-up as regular scheduled with PCP or as needed.  Esperanza Richters, PA-C   Time spent with patient today was 33 minutes which consisted of chart revdew, discussing diagnosis, work up ,treatment and documentation.   Follow Up Instructions:    I discussed the assessment and treatment plan with the patient. The patient was provided an opportunity to ask questions and all were answered. The patient agreed with the plan and demonstrated an understanding of the instructions.   The patient was advised to call back or seek an in-person evaluation if the symptoms worsen or if the condition fails to improve as anticipated.     Esperanza Richters, PA-C   Review of Systems     Objective:   Physical Exam        Assessment & Plan:

## 2021-01-27 NOTE — Telephone Encounter (Signed)
Routed to the front office for appt.

## 2021-01-27 NOTE — Telephone Encounter (Signed)
ATC to schedule appt. VM box is not set up so was unable to leave a message.

## 2021-02-08 ENCOUNTER — Encounter: Payer: Self-pay | Admitting: Gastroenterology

## 2021-02-09 ENCOUNTER — Encounter: Payer: Self-pay | Admitting: Gastroenterology

## 2021-03-09 ENCOUNTER — Other Ambulatory Visit: Payer: Self-pay | Admitting: Internal Medicine

## 2021-03-22 NOTE — Progress Notes (Signed)
Name: Terri [name]  MRN/ DOB: [id number], 11-Aug-1970    Age/ Sex: 50 y.o., female     PCP: Copland, Gwenlyn Found, MD   Reason for Endocrinology Evaluation: Hyperthyroidism     Initial Endocrinology Clinic Visit: 01/06/2021    PATIENT IDENTIFIER: Terri [name] is a 50 y.o., female with a past medical history of HTN and hyperthyroidism. She has followed with [facility] since 01/06/2021 for consultative assistance with management of her Subclinical Hyperthyroidism.   HISTORICAL SUMMARY:   Pt has been noted to have a suppressed TSH in 10/2019 at < 0.01 uIU/mL  with normal FT4, during routine work up.    She has been started on Methimazole in 10/2019 due to symptoms of hyperthyroidism.      No FH of thyroid disease      SUBJECTIVE:     Today (03/23/2021):  Ms. Huether is here for  hyperthyroidism. She has not been to our office in 14 months.   She has noted weight loss  Denies palpitations or tremors  Has diarrhea for the past 2 months  Had one episode of right eye itching but this has resolved.  Has been out of  methimazole for a week   Continues with right hip pain   Methimazole 5 mg daily     HISTORY:  Past Medical History:  Past Medical History:  Diagnosis Date   Abnormal Pap smear of cervix    cryotherapy   Hypertension    Migraine    Past Surgical History:  Past Surgical History:  Procedure Laterality Date   LAPAROSCOPIC BILATERAL SALPINGECTOMY Bilateral 03/18/2014   Procedure: LAPAROSCOPIC BILATERAL SALPINGECTOMY;  Surgeon: Allie Bossier, MD;  Location: [facility];  Service: Gynecology;  Laterality: Bilateral;   LAPAROSCOPIC LYSIS OF ADHESIONS Bilateral 03/18/2014   Procedure: LAPAROSCOPIC LYSIS OF ADHESIONS;  Surgeon: Allie Bossier, MD;  Location: [facility];  Service: Gynecology;  Laterality: Bilateral;   Social History:  reports that she has never smoked. She has never used smokeless tobacco. She reports that she does not drink alcohol and  does not use drugs. Family History:  Family History  Problem Relation Age of Onset   Hypertension Mother    Cancer Maternal Aunt 18       colon Cancer   Cancer Paternal Aunt 20       brest cancer   Heart disease Maternal Grandmother    Stroke Maternal Grandmother    Stroke Paternal Grandmother      HOME MEDICATIONS: Allergies as of 03/23/2021   No Known Allergies      Medication List        Accurate as of March 23, 2021  1:14 PM. If you have any questions, ask your nurse or doctor.          amLODipine 5 MG tablet Commonly known as: NORVASC Take 1 tablet (5 mg total) by mouth daily.   calcium-vitamin D 250-125 MG-UNIT tablet Commonly known as: OSCAL WITH D Take 1 tablet by mouth daily.   famotidine 20 MG tablet Commonly known as: PEPCID Take 1 tablet (20 mg total) by mouth 2 (two) times daily.   methimazole 5 MG tablet Commonly known as: TAPAZOLE Take 1 tablet (5 mg total) by mouth daily.   metoprolol tartrate 50 MG tablet Commonly known as: LOPRESSOR TAKE 1 TABLET BY MOUTH TWICE DAILY . APPOINTMENT REQUIRED FOR FUTURE REFILLS          OBJECTIVE:   PHYSICAL EXAM: VS: BP Marland Kitchen)  150/100   Wt 157 lb (71.2 kg)   BMI 30.66 kg/m    EXAM: General: Pt appears well and is in NAD  Eyes: External eye exam normal without stare, lid lag or exophthalmos.  EOM intact.    Neck: General: Supple without adenopathy. Thyroid: Thyroid size normal.  No goiter or nodules appreciated. No thyroid bruit.  Lungs: Clear with good BS bilat with no rales, rhonchi, or wheezes  Heart: Auscultation: RRR.  Abdomen: Normoactive bowel sounds, soft, nontender, without masses or organomegaly palpable  Extremities:  BL LE: No pretibial edema normal ROM and strength.  Skin: Hair: Texture and amount normal with gender appropriate distribution Skin Inspection: No rashe Skin Palpation: Skin temperature, texture, and thickness normal to palpation  Mental Status: Judgment, insight:  Intact Orientation: Oriented to time, place, and person Mood and affect: No depression, anxiety, or agitation     DATA REVIEWED:  Results for SHAMERIA, TRIMARCO (MRN [id number]) as of 03/23/2021 13:15  Ref. Range 03/23/2021 07:41  Sodium Latest Ref Range: 135 - 145 mEq/L 139  Potassium Latest Ref Range: 3.5 - 5.1 mEq/L 4.1  Chloride Latest Ref Range: 96 - 112 mEq/L 107  CO2 Latest Ref Range: 19 - 32 mEq/L 26  Glucose Latest Ref Range: 70 - 99 mg/dL 89  BUN Latest Ref Range: 6 - 23 mg/dL 15  Creatinine Latest Ref Range: 0.40 - 1.20 mg/dL 4.94  Calcium Latest Ref Range: 8.4 - 10.5 mg/dL 9.6  Alkaline Phosphatase Latest Ref Range: 39 - 117 U/L 88  Albumin Latest Ref Range: 3.5 - 5.2 g/dL 3.8  AST Latest Ref Range: 0 - 37 U/L 24  ALT Latest Ref Range: 0 - 35 U/L 29  Total Protein Latest Ref Range: 6.0 - 8.3 g/dL 6.7  Total Bilirubin Latest Ref Range: 0.2 - 1.2 mg/dL 0.7  GFR Latest Ref Range: >60.00 mL/min 100.87  WBC Latest Ref Range: 4.0 - 10.5 K/uL 5.5  RBC Latest Ref Range: 3.87 - 5.11 Mil/uL 4.60  Hemoglobin Latest Ref Range: 12.0 - 15.0 g/dL 49.6 (L)  HCT Latest Ref Range: 36.0 - 46.0 % 36.6  MCV Latest Ref Range: 78.0 - 100.0 fl 79.5  MCHC Latest Ref Range: 30.0 - 36.0 g/dL 75.9  RDW Latest Ref Range: 11.5 - 15.5 % 18.1 (H)  Platelets Latest Ref Range: 150.0 - 400.0 K/uL 311.0  Neutrophils Latest Ref Range: 43.0 - 77.0 % 50.1  Lymphocytes Latest Ref Range: 12.0 - 46.0 % 35.0  Monocytes Relative Latest Ref Range: 3.0 - 12.0 % 13.7 (H)  Eosinophil Latest Ref Range: 0.0 - 5.0 % 0.8  Basophil Latest Ref Range: 0.0 - 3.0 % 0.4  NEUT# Latest Ref Range: 1.4 - 7.7 K/uL 2.7  Lymphocyte # Latest Ref Range: 0.7 - 4.0 K/uL 1.9  Monocyte # Latest Ref Range: 0.1 - 1.0 K/uL 0.7  Eosinophils Absolute Latest Ref Range: 0.0 - 0.7 K/uL 0.0  Basophils Absolute Latest Ref Range: 0.0 - 0.1 K/uL 0.0  TSH Latest Ref Range: 0.35 - 5.50 uIU/mL 0.00 (L)  T4,Free(Direct) Latest Ref Range: 0.60 - 1.60  ng/dL 1.63 (H)   Results for RHETA, HEMMELGARN (MRN [id number]) as of 03/23/2021 07:30  Ref. Range 01/07/2020 08:30  TRAB Latest Ref Range: <=2.00 IU/L 8.93 (H)    ASSESSMENT / PLAN / RECOMMENDATIONS:   Hyperthyroidism Secondary to Graves' Disease:     - She has not been to our clinic in 14 months, she has only been seen once. We discussed the  importance of keeping up with office visits, we also discussed risk of side effects of Methimazole and the importance of periodic blood work  - She has been out of methimazole for a week but I doubt this elevation is just from one week of no use and suspect that the 5 mg was not enough , hard to tell with no follow up in a year.  - I will restart methimazole and increase the dose.    Medications   Increase Methimazole 5 mg TWO tabs daily    2. Graves' Disease:  - No extra thyroidal manifestations of Graves' disease      F/U in 4 months     Signed electronically by: Lyndle Herrlich, MD  [facility] Endocrinology  [facility] [address] [place], [place] [address] Phone: [phone] FAX: [phone]      CC: Pearline Cables, MD [address] [place] [address] Phone: [phone]  Fax: [phone]   Return to Endocrinology clinic as below: Future Appointments  Date Time Provider Department Center  07/20/2021  7:30 AM Danne Vasek, Konrad Dolores, MD [id number]

## 2021-03-23 ENCOUNTER — Encounter: Payer: Self-pay | Admitting: Internal Medicine

## 2021-03-23 ENCOUNTER — Ambulatory Visit: Payer: 59 | Admitting: Internal Medicine

## 2021-03-23 ENCOUNTER — Other Ambulatory Visit: Payer: Self-pay | Admitting: Internal Medicine

## 2021-03-23 ENCOUNTER — Other Ambulatory Visit: Payer: Self-pay

## 2021-03-23 VITALS — BP 150/100 | Wt 157.0 lb

## 2021-03-23 DIAGNOSIS — E0590 Thyrotoxicosis, unspecified without thyrotoxic crisis or storm: Secondary | ICD-10-CM

## 2021-03-23 DIAGNOSIS — E0500 Thyrotoxicosis with diffuse goiter without thyrotoxic crisis or storm: Secondary | ICD-10-CM

## 2021-03-23 LAB — CBC WITH DIFFERENTIAL/PLATELET
Basophils Absolute: 0 10*3/uL (ref 0.0–0.1)
Basophils Relative: 0.4 % (ref 0.0–3.0)
Eosinophils Absolute: 0 10*3/uL (ref 0.0–0.7)
Eosinophils Relative: 0.8 % (ref 0.0–5.0)
HCT: 36.6 % (ref 36.0–46.0)
Hemoglobin: 11.8 g/dL — ABNORMAL LOW (ref 12.0–15.0)
Lymphocytes Relative: 35 % (ref 12.0–46.0)
Lymphs Abs: 1.9 10*3/uL (ref 0.7–4.0)
MCHC: 32.4 g/dL (ref 30.0–36.0)
MCV: 79.5 fl (ref 78.0–100.0)
Monocytes Absolute: 0.7 10*3/uL (ref 0.1–1.0)
Monocytes Relative: 13.7 % — ABNORMAL HIGH (ref 3.0–12.0)
Neutro Abs: 2.7 10*3/uL (ref 1.4–7.7)
Neutrophils Relative %: 50.1 % (ref 43.0–77.0)
Platelets: 311 10*3/uL (ref 150.0–400.0)
RBC: 4.6 Mil/uL (ref 3.87–5.11)
RDW: 18.1 % — ABNORMAL HIGH (ref 11.5–15.5)
WBC: 5.5 10*3/uL (ref 4.0–10.5)

## 2021-03-23 LAB — COMPREHENSIVE METABOLIC PANEL
ALT: 29 U/L (ref 0–35)
AST: 24 U/L (ref 0–37)
Albumin: 3.8 g/dL (ref 3.5–5.2)
Alkaline Phosphatase: 88 U/L (ref 39–117)
BUN: 15 mg/dL (ref 6–23)
CO2: 26 mEq/L (ref 19–32)
Calcium: 9.6 mg/dL (ref 8.4–10.5)
Chloride: 107 mEq/L (ref 96–112)
Creatinine, Ser: 0.7 mg/dL (ref 0.40–1.20)
GFR: 100.87 mL/min (ref >60.00)
Glucose, Bld: 89 mg/dL (ref 70–99)
Potassium: 4.1 mEq/L (ref 3.5–5.1)
Sodium: 139 mEq/L (ref 135–145)
Total Bilirubin: 0.7 mg/dL (ref 0.2–1.2)
Total Protein: 6.7 g/dL (ref 6.0–8.3)

## 2021-03-23 LAB — T4, FREE: Free T4: 4.71 ng/dL — ABNORMAL HIGH (ref 0.60–1.60)

## 2021-03-23 LAB — TSH: TSH: 0 u[IU]/mL — ABNORMAL LOW (ref 0.35–5.50)

## 2021-03-23 MED ORDER — METHIMAZOLE 5 MG PO TABS: 5.0000 mg | ORAL_TABLET | Freq: Two times a day (BID) | ORAL | 1 refills | Status: DC

## 2021-03-24 ENCOUNTER — Ambulatory Visit: Payer: 59 | Admitting: Gastroenterology

## 2021-03-25 ENCOUNTER — Other Ambulatory Visit: Payer: Self-pay | Admitting: Family Medicine

## 2021-03-25 DIAGNOSIS — I10 Essential (primary) hypertension: Secondary | ICD-10-CM

## 2021-03-28 NOTE — Progress Notes (Addendum)
[facility] [address], [address] [place], [place] [address] [phone]  Date:  03/29/2021   Name:  Terri [name]   DOB:  1971-01-06   MRN:  [id number]  PCP:  Pearline Cables, MD    Chief Complaint: Medication Refill   History of Present Illness:  Terri [name] is a 50 y.o. very pleasant female patient who presents with the following:  Patient seen today for follow-up and medication refills-history of hypertension, migraine headaches, vitamin D deficiency, overweight, subclinical hypothyroidism due to Graves' disease Most recent visit with myself April 2021-at that time she was temporarily without insurance so we deferred some health maintenance  She is under the care of endocrinology, Dr. Alveria Apley for her thyroid.  Currently taking methimazole -recent dose increase to twice a day   COVID-19 series- not done yet.  I encouraged her to get this done ASAP.  Patient relates that her mother "caught COVID for me" last year and was very sick including blood clots and a stroke.  Advised patient that COVID-19 can happen again HIV and hep C screening can be done with routine labs-she would like to do today Colon cancer screening- no family history She would prefer to do a colonoscopy after discussion of options Pap smear- will update today  Shingles vaccine-we are out of this today, we will have to defer  She needs refill of her blood pressure medications, she has been out of her metoprolol for about 24 hours  Patient Active Problem List   Diagnosis Date Noted   Graves disease 03/23/2021   Subclinical hyperthyroidism 01/07/2020   Cough 07/15/2019   Abnormal abdominal CT scan  see 2015 scan  referral to GYN made 08/03/2014   Unspecified vitamin D deficiency 10/15/2012   Essential hypertension, benign 09/24/2012   History of migraine headaches 09/24/2012   Overweight(278.02) 09/24/2012    Past Medical History:   Diagnosis Date   Abnormal Pap smear of cervix    cryotherapy   Hypertension    Migraine     Past Surgical History:  Procedure Laterality Date   LAPAROSCOPIC BILATERAL SALPINGECTOMY Bilateral 03/18/2014   Procedure: LAPAROSCOPIC BILATERAL SALPINGECTOMY;  Surgeon: Allie Bossier, MD;  Location: [facility];  Service: Gynecology;  Laterality: Bilateral;   LAPAROSCOPIC LYSIS OF ADHESIONS Bilateral 03/18/2014   Procedure: LAPAROSCOPIC LYSIS OF ADHESIONS;  Surgeon: Allie Bossier, MD;  Location: [facility];  Service: Gynecology;  Laterality: Bilateral;    Social History   Tobacco Use   Smoking status: Never   Smokeless tobacco: Never  Substance Use Topics   Alcohol use: No   Drug use: No    Family History  Problem Relation Age of Onset   Hypertension Mother    Cancer Maternal Aunt 5       colon Cancer   Cancer Paternal Aunt 50       brest cancer   Heart disease Maternal Grandmother    Stroke Maternal Grandmother    Stroke Paternal Grandmother     No Known Allergies  Medication list has been reviewed and updated.  Current Outpatient Medications on File Prior to Visit  Medication Sig Dispense Refill   amLODipine (NORVASC) 5 MG tablet Take 1 tablet (5 mg total) by mouth daily. 30 tablet 3   calcium-vitamin D (OSCAL WITH D) 250-125 MG-UNIT tablet Take 1 tablet by mouth daily.     famotidine (PEPCID) 20 MG tablet Take 1 tablet (20 mg  total) by mouth 2 (two) times daily. 60 tablet 3   methimazole (TAPAZOLE) 5 MG tablet Take 1 tablet (5 mg total) by mouth 2 (two) times daily. 180 tablet 1   metoprolol tartrate (LOPRESSOR) 50 MG tablet TAKE 1 TABLET BY MOUTH TWICE DAILY . APPOINTMENT REQUIRED FOR FUTURE REFILLS 30 tablet 3   No current facility-administered medications on file prior to visit.    Review of Systems:  As per HPI- otherwise negative.   Physical Examination: Vitals:   03/29/21 1542  BP: (!) 152/88  Pulse: (!) 112  Resp: 17  Temp: (!) 97.1 F (36.2 C)  SpO2: 99%    Vitals:   03/29/21 1542  Weight: 157 lb (71.2 kg)  Height: 5' (1.524 m)   Body mass index is 30.66 kg/m. Ideal Body Weight: Weight in (lb) to have BMI = 25: 127.7  GEN: no acute distress.  Obese, looks well HEENT: Atraumatic, Normocephalic.  Ears and Nose: No external deformity. CV: RRR, No M/G/R. No JVD. No thrill. No extra heart sounds. PULM: CTA B, no wheezes, crackles, rhonchi. No retractions. No resp. distress. No accessory muscle use. ABD: S, NT, ND, +BS. No rebound. No HSM. EXTR: No c/c/e PSYCH: Normally interactive. Conversant.  Pelvic: normal, no vaginal lesions or discharge. Uterus normal, no CMT, no adnexal tendereness or masses  BP Readings from Last 3 Encounters:  03/29/21 (!) 152/88  03/23/21 (!) 150/100  12/11/20 (!) 170/100   Pulse Readings from Last 3 Encounters:  03/29/21 (!) 112  12/11/20 100  01/07/20 67    Assessment and Plan: Hypertension, unspecified type  Graves disease  Essential hypertension - Plan: metoprolol tartrate (LOPRESSOR) 50 MG tablet, amLODipine (NORVASC) 5 MG tablet  Screening for cervical cancer - Plan: Cytology - PAP  Encounter for hepatitis C screening test for low risk patient - Plan: Hepatitis C antibody  Screening for HIV without presence of risk factors - Plan: HIV Antibody (routine testing w rflx)  Screening for hyperlipidemia - Plan: Lipid panel  Seen today for follow-up.  Blood pressure is high, refilled her metoprolol and amlodipine.  She has a home blood pressure cuff.  I asked her to check her blood pressure a few times over the next 2 weeks and update me, she states agreement  Pap done today  Labs are pending as above  Encouraged COVID-19 vaccination ASAP This visit occurred during the SARS-CoV-2 public health emergency.  Safety protocols were in place, including screening questions prior to the visit, additional usage of staff PPE, and extensive cleaning of exam room while observing appropriate contact time as  indicated for disinfecting solutions.   Signed Abbe Amsterdam, MD  Addendum 8/30, received her labs as below-message to patient  Results for orders placed or performed in visit on 03/29/21  Lipid panel  Result Value Ref Range   Cholesterol 104 0 - 200 mg/dL   Triglycerides 25.4 0.0 - 149.0 mg/dL   HDL 27.06 (L) >23.76 mg/dL   VLDL 28.3 0.0 - 15.1 mg/dL   LDL Cholesterol 51 0 - 99 mg/dL   Total CHOL/HDL Ratio 3    NonHDL 68.48    The ASCVD Risk score Denman George DC Jr., et al., 2013) failed to calculate for the following reasons:   The valid total cholesterol range is 130 to 320 mg/dL

## 2021-03-29 ENCOUNTER — Other Ambulatory Visit (HOSPITAL_COMMUNITY)
Admission: RE | Admit: 2021-03-29 | Discharge: 2021-03-29 | Disposition: A | Discharge status: Home / Self Care | Payer: 59 | Source: Ambulatory Visit | Attending: Family Medicine | Admitting: Family Medicine

## 2021-03-29 ENCOUNTER — Other Ambulatory Visit: Payer: Self-pay

## 2021-03-29 ENCOUNTER — Ambulatory Visit: Payer: 59 | Admitting: Family Medicine

## 2021-03-29 VITALS — BP 152/88 | HR 112 | Temp 97.1°F | Resp 17 | Ht 60.0 in | Wt 157.0 lb

## 2021-03-29 DIAGNOSIS — Z124 Encounter for screening for malignant neoplasm of cervix: Secondary | ICD-10-CM | POA: Insufficient documentation

## 2021-03-29 DIAGNOSIS — I10 Essential (primary) hypertension: Secondary | ICD-10-CM

## 2021-03-29 DIAGNOSIS — Z13220 Encounter for screening for lipoid disorders: Secondary | ICD-10-CM

## 2021-03-29 DIAGNOSIS — E0500 Thyrotoxicosis with diffuse goiter without thyrotoxic crisis or storm: Secondary | ICD-10-CM

## 2021-03-29 DIAGNOSIS — Z1159 Encounter for screening for other viral diseases: Secondary | ICD-10-CM | POA: Diagnosis not present

## 2021-03-29 DIAGNOSIS — Z114 Encounter for screening for human immunodeficiency virus [HIV]: Secondary | ICD-10-CM

## 2021-03-29 MED ORDER — AMLODIPINE BESYLATE 5 MG PO TABS: 5.0000 mg | ORAL_TABLET | Freq: Every day | ORAL | 3 refills | Status: DC

## 2021-03-29 MED ORDER — METOPROLOL TARTRATE 50 MG PO TABS: ORAL_TABLET | ORAL | 3 refills | Status: DC

## 2021-03-29 NOTE — Patient Instructions (Addendum)
Good to see you today!  I will be in touch with your labs and pap We will get you referred to see GI for a colonoscopy Please consider getting the shingles vaccine at your convenience

## 2021-03-30 ENCOUNTER — Encounter: Payer: Self-pay | Admitting: Family Medicine

## 2021-03-30 LAB — HEPATITIS C ANTIBODY
Hepatitis C Ab: NONREACTIVE
SIGNAL TO CUT-OFF: 0.02 (ref <1.00)

## 2021-03-30 LAB — LIPID PANEL
Cholesterol: 104 mg/dL (ref 0–200)
HDL: 35.4 mg/dL — ABNORMAL LOW (ref >39.00)
LDL Cholesterol: 51 mg/dL (ref 0–99)
NonHDL: 68.48
Total CHOL/HDL Ratio: 3
Triglycerides: 89 mg/dL (ref 0.0–149.0)
VLDL: 17.8 mg/dL (ref 0.0–40.0)

## 2021-03-30 LAB — HIV ANTIBODY (ROUTINE TESTING W REFLEX): HIV 1&2 Ab, 4th Generation: NONREACTIVE

## 2021-03-31 LAB — CYTOLOGY - PAP
Comment: NEGATIVE
Diagnosis: NEGATIVE
High risk HPV: NEGATIVE

## 2021-04-02 ENCOUNTER — Encounter: Payer: Self-pay | Admitting: Family Medicine

## 2021-05-24 ENCOUNTER — Other Ambulatory Visit: Payer: Self-pay | Admitting: Medical

## 2021-07-19 ENCOUNTER — Ambulatory Visit: Payer: 59 | Admitting: Family Medicine

## 2021-07-20 ENCOUNTER — Ambulatory Visit: Payer: 59 | Admitting: Internal Medicine

## 2021-08-31 ENCOUNTER — Ambulatory Visit: Payer: 59 | Admitting: Internal Medicine

## 2021-09-12 ENCOUNTER — Other Ambulatory Visit: Payer: Self-pay | Admitting: Internal Medicine

## 2021-09-28 ENCOUNTER — Ambulatory Visit: Payer: Self-pay | Admitting: Internal Medicine

## 2021-12-17 ENCOUNTER — Other Ambulatory Visit: Payer: Self-pay | Admitting: Internal Medicine

## 2022-03-20 ENCOUNTER — Other Ambulatory Visit: Payer: Self-pay | Admitting: Internal Medicine

## 2022-03-23 ENCOUNTER — Other Ambulatory Visit: Payer: Self-pay | Admitting: Family Medicine

## 2022-03-23 DIAGNOSIS — I10 Essential (primary) hypertension: Secondary | ICD-10-CM

## 2022-04-23 ENCOUNTER — Other Ambulatory Visit: Payer: Self-pay | Admitting: Internal Medicine

## 2022-04-25 ENCOUNTER — Telehealth: Payer: Self-pay

## 2022-04-25 NOTE — Telephone Encounter (Signed)
Patient states that she doesn't have any insurance right now. She is not sure when she will be able to come in for appointment. Patient was last seen 03/29/21 and cancel appointment on 09/28/21. Patient was due for follow up in December. Pleas advise if a 90 day supply can be sent.

## 2022-04-26 ENCOUNTER — Other Ambulatory Visit: Payer: Self-pay

## 2022-04-26 MED ORDER — METHIMAZOLE 5 MG PO TABS: 5.0000 mg | ORAL_TABLET | Freq: Two times a day (BID) | ORAL | 1 refills | Status: DC

## 2022-04-27 ENCOUNTER — Other Ambulatory Visit: Payer: Self-pay | Admitting: Family Medicine

## 2022-04-27 DIAGNOSIS — I10 Essential (primary) hypertension: Secondary | ICD-10-CM

## 2022-05-29 ENCOUNTER — Other Ambulatory Visit: Payer: Self-pay | Admitting: Family Medicine

## 2022-05-29 DIAGNOSIS — I10 Essential (primary) hypertension: Secondary | ICD-10-CM

## 2022-06-24 NOTE — Patient Instructions (Addendum)
It was good to see you again today, I will be in touch with your lab results as soon as possible. Get back on metoprolol and please send me your recent BP readings in a week or so. If need be I can add back amlodipine  I recommend getting the COVID-19 series if not done already, if already done recommend latest booster I also recommend screening for colon cancer and breast cancer when you are able!    Please try to see me at least every 9 months  Please give Dr Lonzo Cloud a call- I think she can likely see you virtually if that is easier for you.  I ordered your thyroid labs today so they will be ready!    Address: [address] [place], [place] [address] Phone: [phone]

## 2022-06-24 NOTE — Progress Notes (Signed)
[facility] [address] [place], [place] [address] [phone]  Date:  06/29/2022   Name:  Terri [name]   DOB:  06-Mar-1971   MRN:  [id number]  PCP:  Pearline Cables, MD    Chief Complaint: Medication Refill (Concerns/ questions: pt says she has been out of her BP meds for about 3 weeks. Asks for Endo provider that she can see that might be closer.  /Flu shot today: declines/)   History of Present Illness:  Terri [name] is a 51 y.o. very pleasant female patient who presents with the following:  Patient seen today for hypertension follow-up Most recent visit with myself was in August 2022- history of hypertension, migraine headaches, vitamin D deficiency, overweight, subclinical hypothyroidism due to [facility]' disease   Endocrinology care has been through Dr. Lonzo Cloud  Colon cancer screening- recommend, defer for now due to cost  Pap smear- she is UTD  Mammogram-  ordered and provided mammo scholarship info Shingrix- recommend  Flu shot-recommend at pharmacy for cost  Recommend COVID booster if not done already  Most recent labs on chart from August 2022  She was on metoprolol but ran out 3 weeks ago  She did come off her amlodipine "on my own a while ago"  She is not really monitoring her BP even when she was on metoprolol so she does not know how it looked  BP Readings from Last 3 Encounters:  06/29/22 (!) 162/98  03/29/21 (!) 152/88  03/23/21 (!) 150/100   She is really busy taking care of her mom and does not have as much time for her own healthcare.  In addition, she is currently without insurance   Patient Active Problem List   Diagnosis Date Noted   Graves disease 03/23/2021   Subclinical hyperthyroidism 01/07/2020   Cough 07/15/2019   Abnormal abdominal CT scan  see 2015 scan  referral to GYN made 08/03/2014   Unspecified vitamin D deficiency 10/15/2012   Essential hypertension,  benign 09/24/2012   History of migraine headaches 09/24/2012   Overweight(278.02) 09/24/2012    Past Medical History:  Diagnosis Date   Abnormal Pap smear of cervix    cryotherapy   Hypertension    Migraine     Past Surgical History:  Procedure Laterality Date   LAPAROSCOPIC BILATERAL SALPINGECTOMY Bilateral 03/18/2014   Procedure: LAPAROSCOPIC BILATERAL SALPINGECTOMY;  Surgeon: Allie Bossier, MD;  Location: [facility];  Service: Gynecology;  Laterality: Bilateral;   LAPAROSCOPIC LYSIS OF ADHESIONS Bilateral 03/18/2014   Procedure: LAPAROSCOPIC LYSIS OF ADHESIONS;  Surgeon: Allie Bossier, MD;  Location: [facility];  Service: Gynecology;  Laterality: Bilateral;    Social History   Tobacco Use   Smoking status: Never   Smokeless tobacco: Never  Substance Use Topics   Alcohol use: No   Drug use: No    Family History  Problem Relation Age of Onset   Hypertension Mother    Cancer Maternal Aunt 38       colon Cancer   Cancer Paternal Aunt 24       brest cancer   Heart disease Maternal Grandmother    Stroke Maternal Grandmother    Stroke Paternal Grandmother     No Known Allergies  Medication list has been reviewed and updated.  Current Outpatient Medications on File Prior to Visit  Medication Sig Dispense Refill   calcium-vitamin D (OSCAL WITH D) 250-125 MG-UNIT tablet Take 1  tablet by mouth daily.     famotidine (PEPCID) 20 MG tablet Take 1 tablet by mouth twice daily 60 tablet 3   methimazole (TAPAZOLE) 5 MG tablet Take 1 tablet (5 mg total) by mouth 2 (two) times daily. 180 tablet 1   metoprolol tartrate (LOPRESSOR) 50 MG tablet TAKE 1 TABLET BY MOUTH TWICE DAILY . APPOINTMENT REQUIRED FOR FUTURE REFILLS 60 tablet 0   No current facility-administered medications on file prior to visit.    Review of Systems:  As per HPI- otherwise negative.   Physical Examination: Vitals:   06/29/22 0837  BP: (!) 162/98  Pulse: (!) 105  Resp: 18  Temp: 97.7 F (36.5 C)  SpO2: 99%    Vitals:   06/29/22 0837  Weight: 174 lb 9.6 oz (79.2 kg)  Height: 5' (1.524 m)   Body mass index is 34.1 kg/m. Ideal Body Weight: Weight in (lb) to have BMI = 25: 127.7  GEN: no acute distress. Mild obesity, looks well  HEENT: Atraumatic, Normocephalic.  Ears and Nose: No external deformity. CV: RRR, No M/G/R. No JVD. No thrill. No extra heart sounds. PULM: CTA B, no wheezes, crackles, rhonchi. No retractions. No resp. distress. No accessory muscle use. ABD: S, NT, ND. No rebound. No HSM. EXTR: No c/c/e PSYCH: Normally interactive. Conversant.    Assessment and Plan: Hypertension, unspecified type - Plan: CBC, Comprehensive metabolic panel  Subclinical hyperthyroidism - Plan: TSH  Screening for hyperlipidemia - Plan: Lipid panel  Graves disease - Plan: TSH, T4, free  Screening for colon cancer  Screening mammogram for breast cancer  Screening for diabetes mellitus - Plan: Hemoglobin A1c  Essential hypertension - Plan: metoprolol tartrate (LOPRESSOR) 50 MG tablet  Encounter for screening mammogram for malignant neoplasm of breast - Plan: MM 3D SCREEN BREAST BILATERAL  Patient seen today for follow-up.  She has been absent from healthcare for a while, cost concerns.  We will have her start back on metoprolol.  She will check her blood pressures at home and update me in about a week, I can add back amlodipine if need be Gave her application for mammogram scholarship program and ordered mammogram Mention colon cancer screening, she declines for right now Lab work is pending as above, we will be in touch with these results Signed Abbe Amsterdam, MD   Results for orders placed or performed in visit on 06/29/22  CBC  Result Value Ref Range   WBC 9.3 4.0 - 10.5 K/uL   RBC 4.39 3.87 - 5.11 Mil/uL   Platelets 375.0 150.0 - 400.0 K/uL   Hemoglobin 12.7 12.0 - 15.0 g/dL   HCT 82.4 23.5 - 36.1 %   MCV 87.1 78.0 - 100.0 fl   MCHC 33.1 30.0 - 36.0 g/dL   RDW 44.3 (H)  15.4 - 15.5 %  Comprehensive metabolic panel  Result Value Ref Range   Sodium 138 135 - 145 mEq/L   Potassium 4.1 3.5 - 5.1 mEq/L   Chloride 106 96 - 112 mEq/L   CO2 25 19 - 32 mEq/L   Glucose, Bld 89 70 - 99 mg/dL   BUN 13 6 - 23 mg/dL   Creatinine, Ser 0.08 0.40 - 1.20 mg/dL   Total Bilirubin 0.5 0.2 - 1.2 mg/dL   Alkaline Phosphatase 68 39 - 117 U/L   AST 19 0 - 37 U/L   ALT 24 0 - 35 U/L   Total Protein 7.4 6.0 - 8.3 g/dL   Albumin 4.5 3.5 - 5.2  g/dL   GFR 95.18 >84.16 mL/min   Calcium 9.3 8.4 - 10.5 mg/dL  Hemoglobin S0Y  Result Value Ref Range   Hgb A1c MFr Bld 5.8 4.6 - 6.5 %  Lipid panel  Result Value Ref Range   Cholesterol 189 0 - 200 mg/dL   Triglycerides 30.1 0.0 - 149.0 mg/dL   HDL 60.10 >93.23 mg/dL   VLDL 55.7 0.0 - 32.2 mg/dL   LDL Cholesterol 025 (H) 0 - 99 mg/dL   Total CHOL/HDL Ratio 3    NonHDL 134.75   TSH  Result Value Ref Range   TSH 12.93 (H) 0.35 - 5.50 uIU/mL  T4, free  Result Value Ref Range   Free T4 0.39 (L) 0.60 - 1.60 ng/dL

## 2022-06-29 ENCOUNTER — Ambulatory Visit (INDEPENDENT_AMBULATORY_CARE_PROVIDER_SITE_OTHER): Payer: Self-pay | Admitting: Family Medicine

## 2022-06-29 ENCOUNTER — Encounter: Payer: Self-pay | Admitting: Family Medicine

## 2022-06-29 VITALS — BP 162/98 | HR 105 | Temp 97.7°F | Resp 18 | Ht 60.0 in | Wt 174.6 lb

## 2022-06-29 DIAGNOSIS — Z131 Encounter for screening for diabetes mellitus: Secondary | ICD-10-CM

## 2022-06-29 DIAGNOSIS — E0500 Thyrotoxicosis with diffuse goiter without thyrotoxic crisis or storm: Secondary | ICD-10-CM

## 2022-06-29 DIAGNOSIS — Z1231 Encounter for screening mammogram for malignant neoplasm of breast: Secondary | ICD-10-CM

## 2022-06-29 DIAGNOSIS — Z13220 Encounter for screening for lipoid disorders: Secondary | ICD-10-CM

## 2022-06-29 DIAGNOSIS — E0590 Thyrotoxicosis, unspecified without thyrotoxic crisis or storm: Secondary | ICD-10-CM

## 2022-06-29 DIAGNOSIS — I10 Essential (primary) hypertension: Secondary | ICD-10-CM

## 2022-06-29 DIAGNOSIS — Z1211 Encounter for screening for malignant neoplasm of colon: Secondary | ICD-10-CM

## 2022-06-29 LAB — CBC
HCT: 38.3 % (ref 36.0–46.0)
Hemoglobin: 12.7 g/dL (ref 12.0–15.0)
MCHC: 33.1 g/dL (ref 30.0–36.0)
MCV: 87.1 fl (ref 78.0–100.0)
Platelets: 375 10*3/uL (ref 150.0–400.0)
RBC: 4.39 Mil/uL (ref 3.87–5.11)
RDW: 15.8 % — ABNORMAL HIGH (ref 11.5–15.5)
WBC: 9.3 10*3/uL (ref 4.0–10.5)

## 2022-06-29 LAB — T4, FREE: Free T4: 0.39 ng/dL — ABNORMAL LOW (ref 0.60–1.60)

## 2022-06-29 LAB — COMPREHENSIVE METABOLIC PANEL
ALT: 24 U/L (ref 0–35)
AST: 19 U/L (ref 0–37)
Albumin: 4.5 g/dL (ref 3.5–5.2)
Alkaline Phosphatase: 68 U/L (ref 39–117)
BUN: 13 mg/dL (ref 6–23)
CO2: 25 mEq/L (ref 19–32)
Calcium: 9.3 mg/dL (ref 8.4–10.5)
Chloride: 106 mEq/L (ref 96–112)
Creatinine, Ser: 0.77 mg/dL (ref 0.40–1.20)
GFR: 89.17 mL/min (ref >60.00)
Glucose, Bld: 89 mg/dL (ref 70–99)
Potassium: 4.1 mEq/L (ref 3.5–5.1)
Sodium: 138 mEq/L (ref 135–145)
Total Bilirubin: 0.5 mg/dL (ref 0.2–1.2)
Total Protein: 7.4 g/dL (ref 6.0–8.3)

## 2022-06-29 LAB — TSH: TSH: 12.93 u[IU]/mL — ABNORMAL HIGH (ref 0.35–5.50)

## 2022-06-29 LAB — HEMOGLOBIN A1C: Hgb A1c MFr Bld: 5.8 % (ref 4.6–6.5)

## 2022-06-29 LAB — LIPID PANEL
Cholesterol: 189 mg/dL (ref 0–200)
HDL: 54.4 mg/dL (ref >39.00)
LDL Cholesterol: 116 mg/dL — ABNORMAL HIGH (ref 0–99)
NonHDL: 134.75
Total CHOL/HDL Ratio: 3
Triglycerides: 94 mg/dL (ref 0.0–149.0)
VLDL: 18.8 mg/dL (ref 0.0–40.0)

## 2022-06-29 MED ORDER — METOPROLOL TARTRATE 50 MG PO TABS: ORAL_TABLET | ORAL | 3 refills | Status: DC

## 2022-07-08 ENCOUNTER — Encounter: Payer: Self-pay | Admitting: Internal Medicine

## 2022-07-08 ENCOUNTER — Encounter: Payer: Self-pay | Admitting: Family Medicine

## 2022-07-08 ENCOUNTER — Telehealth (INDEPENDENT_AMBULATORY_CARE_PROVIDER_SITE_OTHER): Payer: Self-pay | Admitting: Internal Medicine

## 2022-07-08 VITALS — BP 136/82 | HR 79 | Ht 60.0 in

## 2022-07-08 DIAGNOSIS — I10 Essential (primary) hypertension: Secondary | ICD-10-CM

## 2022-07-08 DIAGNOSIS — E0590 Thyrotoxicosis, unspecified without thyrotoxic crisis or storm: Secondary | ICD-10-CM

## 2022-07-08 DIAGNOSIS — E0500 Thyrotoxicosis with diffuse goiter without thyrotoxic crisis or storm: Secondary | ICD-10-CM

## 2022-07-08 MED ORDER — AMLODIPINE BESYLATE 2.5 MG PO TABS: 2.5000 mg | ORAL_TABLET | Freq: Every day | ORAL | 3 refills | Status: DC

## 2022-07-08 NOTE — Progress Notes (Signed)
Virtual Visit via Video Note  I connected with Terri [name] on 07/08/22 at 12:10 pm by a video enabled telemedicine application and verified that I am speaking with the correct person using two identifiers.   I discussed the limitations of evaluation and management by telemedicine and the availability of in person appointments. The patient expressed understanding and agreed to proceed.   -Location of the patient : home -Location of the provider : Office -The names of all persons participating in the telemedicine service : Pt and myself        Name: Terri [name]  MRN/ DOB: [id number], [birth year]    Age/ Sex: 51 y.o., female     PCP: Pearline Cables, MD   Reason for Endocrinology Evaluation: Hyperthyroidism     Initial Endocrinology Clinic Visit: 01/06/2021    PATIENT IDENTIFIER: [name] is a 51 y.o., female with a past medical history of HTN and hyperthyroidism. She has followed with [facility] since 01/06/2021 for consultative assistance with management of her Subclinical Hyperthyroidism.   HISTORICAL SUMMARY:   Pt has been noted to have a suppressed TSH in 10/2019 at < 0.01 uIU/mL  with normal FT4, during routine work up.    She has been started on Methimazole in 10/2019 due to symptoms of hyperthyroidism.      No FH of thyroid disease      SUBJECTIVE:     Today (07/08/2022):  Ms. Older is here for  hyperthyroidism. She has not been to our office in 16 months. She has been without health insurance in ~ 10 months  The visit prior to that she has not been to our office in 14 months.   Weight has been stable She has been constipated Denies palpitations or tremors  Denies local neck symptoms   Methimazole 5 mg BID     HISTORY:  Past Medical History:  Past Medical History:  Diagnosis Date   Abnormal Pap smear of cervix    cryotherapy   Hypertension    Migraine    Past Surgical History:  Past Surgical History:   Procedure Laterality Date   LAPAROSCOPIC BILATERAL SALPINGECTOMY Bilateral 03/18/2014   Procedure: LAPAROSCOPIC BILATERAL SALPINGECTOMY;  Surgeon: Allie Bossier, MD;  Location: [facility];  Service: Gynecology;  Laterality: Bilateral;   LAPAROSCOPIC LYSIS OF ADHESIONS Bilateral 03/18/2014   Procedure: LAPAROSCOPIC LYSIS OF ADHESIONS;  Surgeon: Allie Bossier, MD;  Location: [facility];  Service: Gynecology;  Laterality: Bilateral;   Social History:  reports that she has never smoked. She has never used smokeless tobacco. She reports that she does not drink alcohol and does not use drugs. Family History:  Family History  Problem Relation Age of Onset   Hypertension Mother    Cancer Maternal Aunt 43       colon Cancer   Cancer Paternal Aunt 87       brest cancer   Heart disease Maternal Grandmother    Stroke Maternal Grandmother    Stroke Paternal Grandmother      HOME MEDICATIONS: Allergies as of 07/08/2022   No Known Allergies      Medication List        Accurate as of July 08, 2022  7:31 AM. If you have any questions, ask your nurse or doctor.          calcium-vitamin D 250-125 MG-UNIT tablet Commonly known as: OSCAL WITH D Take 1 tablet by mouth daily.   famotidine 20 MG tablet Commonly  known as: PEPCID Take 1 tablet by mouth twice daily   methimazole 5 MG tablet Commonly known as: TAPAZOLE Take 1 tablet (5 mg total) by mouth 2 (two) times daily.   metoprolol tartrate 50 MG tablet Commonly known as: LOPRESSOR TAKE 1 TABLET BY MOUTH TWICE DAILY .        DATA REVIEWED:    Latest Reference Range & Units 06/29/22 09:04  COMPREHENSIVE METABOLIC PANEL  Rpt  Sodium 135 - 145 mEq/L 138  Potassium 3.5 - 5.1 mEq/L 4.1  Chloride 96 - 112 mEq/L 106  CO2 19 - 32 mEq/L 25  Glucose 70 - 99 mg/dL 89  BUN 6 - 23 mg/dL 13  Creatinine 1.60 - 7.37 mg/dL 1.06  Calcium 8.4 - 26.9 mg/dL 9.3  Alkaline Phosphatase 39 - 117 U/L 68  Albumin 3.5 - 5.2 g/dL 4.5  AST 0 - 37 U/L 19   ALT 0 - 35 U/L 24  Total Protein 6.0 - 8.3 g/dL 7.4  Total Bilirubin 0.2 - 1.2 mg/dL 0.5  GFR >48.54 mL/min 89.17  Total CHOL/HDL Ratio  3  Cholesterol 0 - 200 mg/dL 627  HDL Cholesterol >03.50 mg/dL 09.38  LDL (calc) 0 - 99 mg/dL 182 (H)  NonHDL  993.71  Triglycerides 0.0 - 149.0 mg/dL 69.6  VLDL 0.0 - 78.9 mg/dL 38.1  WBC 4.0 - 01.7 K/uL 9.3  RBC 3.87 - 5.11 Mil/uL 4.39  Hemoglobin 12.0 - 15.0 g/dL 51.0  HCT 25.8 - 52.7 % 38.3  MCV 78.0 - 100.0 fl 87.1  MCHC 30.0 - 36.0 g/dL 78.2  RDW 42.3 - 53.6 % 15.8 (H)  Platelets 150.0 - 400.0 K/uL 375.0    Latest Reference Range & Units 06/29/22 09:04  TSH 0.35 - 5.50 uIU/mL 12.93 (H)  T4,Free(Direct) 0.60 - 1.60 ng/dL 1.44 (L)    Results for LAZARIAH, SAVARD (MRN [id number]) as of 03/23/2021 07:30  Ref. Range 01/07/2020 08:30  TRAB Latest Ref Range: <=2.00 IU/L 8.93 (H)    ASSESSMENT / PLAN / RECOMMENDATIONS:   Hyperthyroidism Secondary to Graves' Disease:     - She has not been to our clinic in 16 months, and last year she did not come for 14 months.   -We again discussed the importance of keeping up with office visits, we also discussed risk of side effects of Methimazole and the importance of periodic blood work  -At this time she is biochemically hypothyroid -She was advised to hold methimazole Saturday and Sunday of this week, and starting next week she needs to take methimazole once daily Monday through Saturday and none on Sundays  Medications   Decrease methimazole 5 mg 1 tablet Monday through Saturday, none on Sundays   2. Graves' Disease:  - No extra thyroidal manifestations of Graves' disease      Emphasized the importance of having labs in 4 weeks  She was given the option to either go to [facility] or [facility].  Labs have been entered  F/U in 4 months   Signed electronically by: Lyndle Herrlich, MD  [facility]  [facility] [address] [place] [place], [place]  [address] Phone: [phone] FAX: [phone]      CC: Pearline Cables, MD [address] [place] [address] Phone: [phone]  Fax: [phone]   Return to Endocrinology clinic as below: Future Appointments  Date Time Provider Department Center  07/08/2022 12:10 PM Tiwan Schnitker, Konrad Dolores, MD [facility] None

## 2022-07-08 NOTE — Addendum Note (Signed)
Addended by: Abbe Amsterdam C on: 07/08/2022 12:59 PM   Modules accepted: Orders

## 2022-07-26 ENCOUNTER — Telehealth (HOSPITAL_BASED_OUTPATIENT_CLINIC_OR_DEPARTMENT_OTHER): Payer: Self-pay

## 2022-08-08 ENCOUNTER — Other Ambulatory Visit: Payer: Self-pay

## 2022-08-08 ENCOUNTER — Other Ambulatory Visit (INDEPENDENT_AMBULATORY_CARE_PROVIDER_SITE_OTHER): Payer: Self-pay

## 2022-08-08 DIAGNOSIS — E0590 Thyrotoxicosis, unspecified without thyrotoxic crisis or storm: Secondary | ICD-10-CM

## 2022-08-08 LAB — T4, FREE: Free T4: 0.6 ng/dL (ref 0.60–1.60)

## 2022-12-20 ENCOUNTER — Ambulatory Visit: Payer: Self-pay | Admitting: Internal Medicine

## 2023-01-02 ENCOUNTER — Encounter: Payer: Self-pay | Admitting: Internal Medicine

## 2023-01-02 ENCOUNTER — Ambulatory Visit (INDEPENDENT_AMBULATORY_CARE_PROVIDER_SITE_OTHER): Payer: 59 | Admitting: Internal Medicine

## 2023-01-02 VITALS — BP 138/84 | HR 75 | Ht 60.0 in | Wt 178.0 lb

## 2023-01-02 DIAGNOSIS — E0500 Thyrotoxicosis with diffuse goiter without thyrotoxic crisis or storm: Secondary | ICD-10-CM

## 2023-01-02 DIAGNOSIS — M2550 Pain in unspecified joint: Secondary | ICD-10-CM | POA: Diagnosis not present

## 2023-01-02 DIAGNOSIS — R053 Chronic cough: Secondary | ICD-10-CM | POA: Diagnosis not present

## 2023-01-02 DIAGNOSIS — E0590 Thyrotoxicosis, unspecified without thyrotoxic crisis or storm: Secondary | ICD-10-CM

## 2023-01-02 LAB — VITAMIN D 25 HYDROXY (VIT D DEFICIENCY, FRACTURES): VITD: 69.28 ng/mL (ref 30.00–100.00)

## 2023-01-02 LAB — T4, FREE: Free T4: 0.61 ng/dL (ref 0.60–1.60)

## 2023-01-02 LAB — TSH: TSH: 0.68 u[IU]/mL (ref 0.35–5.50)

## 2023-01-02 MED ORDER — METHIMAZOLE 5 MG PO TABS: 5.0000 mg | ORAL_TABLET | ORAL | 2 refills | Status: DC

## 2023-01-02 NOTE — Progress Notes (Signed)
Name: Terri [name]  MRN/ DOB: [id number], 05-02-1971    Age/ Sex: 52 y.o., female     PCP: Copland, Gwenlyn Found, MD   Reason for Endocrinology Evaluation: Hyperthyroidism     Initial Endocrinology Clinic Visit: 01/06/2021    PATIENT IDENTIFIER: Terri [name] is a 52 y.o., female with a past medical history of HTN and hyperthyroidism. She has followed with [facility] since 01/06/2021 for consultative assistance with management of her Subclinical Hyperthyroidism.   HISTORICAL SUMMARY:   Pt has been noted to have a suppressed TSH in 10/2019 at < 0.01 uIU/mL  with normal FT4, during routine work up.    She has been started on Methimazole in 10/2019 due to symptoms of hyperthyroidism.      No FH of thyroid disease      SUBJECTIVE:     Today (01/02/2023):  Terri [name] is here for  hyperthyroidism.  Weight has been stable Has chronic cough which is worsening in nature with arthralgia and back pain  Fatigue and constipation  She has been constipated Denies palpitations or tremors  Denies local neck symptoms  Denies eye burning or itching  Denies heartburn    Methimazole 5 mg, 1 tablet Monday through Friday    HISTORY:  Past Medical History:  Past Medical History:  Diagnosis Date   Abnormal Pap smear of cervix    cryotherapy   Hypertension    Migraine    Past Surgical History:  Past Surgical History:  Procedure Laterality Date   LAPAROSCOPIC BILATERAL SALPINGECTOMY Bilateral 03/18/2014   Procedure: LAPAROSCOPIC BILATERAL SALPINGECTOMY;  Surgeon: Allie Bossier, MD;  Location: [facility];  Service: Gynecology;  Laterality: Bilateral;   LAPAROSCOPIC LYSIS OF ADHESIONS Bilateral 03/18/2014   Procedure: LAPAROSCOPIC LYSIS OF ADHESIONS;  Surgeon: Allie Bossier, MD;  Location: [facility];  Service: Gynecology;  Laterality: Bilateral;   Social History:  reports that she has never smoked. She has never used smokeless tobacco. She reports that she does not  drink alcohol and does not use drugs. Family History:  Family History  Problem Relation Age of Onset   Hypertension Mother    Cancer Maternal Aunt 21       colon Cancer   Cancer Paternal Aunt 68       brest cancer   Heart disease Maternal Grandmother    Stroke Maternal Grandmother    Stroke Paternal Grandmother      HOME MEDICATIONS: Allergies as of 01/02/2023   No Known Allergies      Medication List        Accurate as of January 02, 2023  7:03 AM. If you have any questions, ask your nurse or doctor.          amLODipine 2.5 MG tablet Commonly known as: NORVASC Take 1 tablet (2.5 mg total) by mouth daily.   calcium-vitamin D 250-125 MG-UNIT tablet Commonly known as: OSCAL WITH D Take 1 tablet by mouth daily.   famotidine 20 MG tablet Commonly known as: PEPCID Take 1 tablet by mouth twice daily   methimazole 5 MG tablet Commonly known as: TAPAZOLE Take 1 tablet (5 mg total) by mouth 2 (two) times daily.   metoprolol tartrate 50 MG tablet Commonly known as: LOPRESSOR TAKE 1 TABLET BY MOUTH TWICE DAILY .        DATA REVIEWED:    Latest Reference Range & Units 06/29/22 09:04  COMPREHENSIVE METABOLIC PANEL  Rpt  Sodium 135 - 145  mEq/L 138  Potassium 3.5 - 5.1 mEq/L 4.1  Chloride 96 - 112 mEq/L 106  CO2 19 - 32 mEq/L 25  Glucose 70 - 99 mg/dL 89  BUN 6 - 23 mg/dL 13  Creatinine 1.61 - 0.96 mg/dL 0.45  Calcium 8.4 - 40.9 mg/dL 9.3  Alkaline Phosphatase 39 - 117 U/L 68  Albumin 3.5 - 5.2 g/dL 4.5  AST 0 - 37 U/L 19  ALT 0 - 35 U/L 24  Total Protein 6.0 - 8.3 g/dL 7.4  Total Bilirubin 0.2 - 1.2 mg/dL 0.5  GFR >81.19 mL/min 89.17  Total CHOL/HDL Ratio  3  Cholesterol 0 - 200 mg/dL 147  HDL Cholesterol >82.95 mg/dL 62.13  LDL (calc) 0 - 99 mg/dL 086 (H)  NonHDL  578.46  Triglycerides 0.0 - 149.0 mg/dL 96.2  VLDL 0.0 - 95.2 mg/dL 84.1  WBC 4.0 - 32.4 K/uL 9.3  RBC 3.87 - 5.11 Mil/uL 4.39  Hemoglobin 12.0 - 15.0 g/dL 40.1  HCT 02.7 - 25.3 % 38.3  MCV  78.0 - 100.0 fl 87.1  MCHC 30.0 - 36.0 g/dL 66.4  RDW 40.3 - 47.4 % 15.8 (H)  Platelets 150.0 - 400.0 K/uL 375.0    Results for HONOKA, BYARD (MRN [id number]) as of 03/23/2021 07:30  Ref. Range 01/07/2020 08:30  TRAB Latest Ref Range: <=2.00 IU/L 8.93 (H)    ASSESSMENT / PLAN / RECOMMENDATIONS:   Hyperthyroidism Secondary to Graves' Disease:   -Patient is clinically euthyroid -No local neck symptoms -She has been compliant with methimazole intake -TFTs are within normal range, will reduce as below    Medications   Decrease methimazole 5 mg 1 tablet Monday through Thursday, none the rest of the week  2. Graves' Disease:  - No extra thyroidal manifestations of Graves' disease      3.  Chronic cough:  -This has been chronic in nature but worsening -During exam patient has been noted postnasal drip -I have discussed differential to include GERD versus allergies -I have advised the patient to try Claritin 10 mg daily for 2 weeks, if this is not helping she may switch to Prilosec 20 mg daily for 2 weeks   4.  Arthralgia:  -Most likely due to osteoarthritis -Vitamin D is normal   F/U in 4 months   Signed electronically by: Lyndle Herrlich, MD  [facility]  [facility] [address] [place] [place], [place] [address] Phone: [phone] FAX: [phone]      CC: Pearline Cables, MD [address] [place] [address] Phone: [phone]  Fax: [phone]   Return to Endocrinology clinic as below: Future Appointments  Date Time Provider Department Center  01/02/2023  8:10 AM Tyrian Peart, Konrad Dolores, MD [facility] None

## 2023-01-02 NOTE — Patient Instructions (Addendum)
Take Claritin daily for 2 weeks, if symptoms improve continue for 6 weeks, if symptoms  don't improve please stop it and start Prilosec 20 mg daily for 2 weeks , is symptoms improve continue for 6 weeks

## 2023-06-01 ENCOUNTER — Ambulatory Visit: Payer: 59 | Admitting: Family Medicine

## 2023-06-17 NOTE — Progress Notes (Signed)
Healthcare at [facility] [address] [place], [place] [address] [phone]  Date:  06/21/2023   Name:  Terri [name]   DOB:  08/31/1970   MRN:  [id number]  PCP:  Pearline Cables, MD    Chief Complaint: Cough (Pt says she has had the cough since 2017. It has worsened. She has tried claritin, and Pepcid- no relief. Cough is productive. /Flu shot today: declines/Colon: never/Tdap: pt believes this is UTD/Shingrix: never)   History of Present Illness:  Terri [name] is a 52 y.o. very pleasant female patient who presents with the following:  Pt seen today with concern of cough Last seen by myself about one year ago -  history of hypertension, migraine headaches, vitamin D deficiency, overweight, subclinical hypothyroidism due to Graves' disease  Seen by Dr Lonzo Cloud over the summer: Hyperthyroidism Secondary to Graves' Disease:  -Patient is clinically euthyroid -No local neck symptoms -She has been compliant with methimazole intake -TFTs are within normal range, will reduce as below Medications  Decrease methimazole 5 mg 1 tablet Monday through Thursday, none the rest of the week 2. Graves' Disease: - No extra thyroidal manifestations of Graves' disease  3.  Chronic cough: -This has been chronic in nature but worsening -During exam patient has been noted postnasal drip -I have discussed differential to include GERD versus allergies -I have advised the patient to try Claritin 10 mg daily for 2 weeks, if this is not helping she may switch to Prilosec 20 mg daily for 2 weeks   Tetanus booster-recommended Flu- declines  Colon cancer screening-not done yet, referral today Mammo- order  Shingrix-recommended Covid booster-recommended Pap 9/22- negative   She notes a cough for 7-8 years.  However it seems to be worse over the last year or so She sometimes may bring up mucus but not always She discussed with Dr Lonzo Cloud  over the summer- she tried claritin for a month and then pepcid for a month on her advice, unfortunately this did not resolve her cough No coughing up blood  No unexpected weight loss She did have a CXR in 2018- negative She is sensitive to smells like purfume, also will cough with laughing  Might be worse when she is supine at night  Never a smoker- minor exposure to 2nd hand as a child   She most recently used a PPI several months ago   Wt Readings from Last 3 Encounters:  06/21/23 180 lb 9.6 oz (81.9 kg)  01/02/23 178 lb (80.7 kg)  06/29/22 174 lb 9.6 oz (79.2 kg)     Patient Active Problem List   Diagnosis Date Noted   Graves disease 03/23/2021   Subclinical hyperthyroidism 01/07/2020   Cough 07/15/2019   Abnormal abdominal CT scan  see 2015 scan  referral to GYN made 08/03/2014   Vitamin D deficiency 10/15/2012   Essential hypertension, benign 09/24/2012   History of migraine headaches 09/24/2012   Overweight 09/24/2012    Past Medical History:  Diagnosis Date   Abnormal Pap smear of cervix    cryotherapy   Hypertension    Migraine     Past Surgical History:  Procedure Laterality Date   LAPAROSCOPIC BILATERAL SALPINGECTOMY Bilateral 03/18/2014   Procedure: LAPAROSCOPIC BILATERAL SALPINGECTOMY;  Surgeon: Allie Bossier, MD;  Location: [facility];  Service: Gynecology;  Laterality: Bilateral;   LAPAROSCOPIC LYSIS OF ADHESIONS Bilateral 03/18/2014   Procedure: LAPAROSCOPIC LYSIS OF ADHESIONS;  Surgeon: Hollie Salk  Inda Coke, MD;  Location: [facility];  Service: Gynecology;  Laterality: Bilateral;    Social History   Tobacco Use   Smoking status: Never   Smokeless tobacco: Never  Substance Use Topics   Alcohol use: No   Drug use: No    Family History  Problem Relation Age of Onset   Hypertension Mother    Cancer Maternal Aunt 35       colon Cancer   Cancer Paternal Aunt 78       brest cancer   Heart disease Maternal Grandmother    Stroke Maternal Grandmother    Stroke  Paternal Grandmother     No Known Allergies  Medication list has been reviewed and updated.  Current Outpatient Medications on File Prior to Visit  Medication Sig Dispense Refill   amLODipine (NORVASC) 2.5 MG tablet Take 1 tablet (2.5 mg total) by mouth daily. 90 tablet 3   calcium-vitamin D (OSCAL WITH D) 250-125 MG-UNIT tablet Take 1 tablet by mouth daily.     methimazole (TAPAZOLE) 5 MG tablet Take 1 tablet (5 mg total) by mouth as directed. 1 tablet Monday through Thursday, none the rest of the week 48 tablet 2   metoprolol tartrate (LOPRESSOR) 50 MG tablet TAKE 1 TABLET BY MOUTH TWICE DAILY . 180 tablet 3   No current facility-administered medications on file prior to visit.    Review of Systems:  As per HPI- otherwise negative.   Physical Examination: Vitals:   06/21/23 0817 06/21/23 0838  BP: (!) 180/102 (!) 150/100  Pulse: 80   Resp: 18   Temp: 98.3 F (36.8 C)   SpO2: 99%    Vitals:   06/21/23 0817  Weight: 180 lb 9.6 oz (81.9 kg)  Height: 5' (1.524 m)   Body mass index is 35.27 kg/m. Ideal Body Weight: Weight in (lb) to have BMI = 25: 127.7  GEN: no acute distress.  Obese, looks well HEENT: Atraumatic, Normocephalic.  Bilateral TM wnl, oropharynx normal.  PEERL,EOMI.   Ears and Nose: No external deformity. CV: RRR, No M/G/R. No JVD. No thrill. No extra heart sounds. PULM: CTA B, no wheezes, crackles, rhonchi. No retractions. No resp. distress. No accessory muscle use. ABD: S, NT, ND, +BS. No rebound. No HSM. EXTR: No c/c/e PSYCH: Normally interactive. Conversant.   BP Readings from Last 3 Encounters:  06/21/23 (!) 150/100  01/02/23 138/84  07/08/22 136/82    Assessment and Plan: Hypertension, unspecified type - Plan: CBC, Comprehensive metabolic panel  Subclinical hyperthyroidism  Screening for hyperlipidemia - Plan: Lipid panel  Screening for colon cancer - Plan: Ambulatory referral to Gastroenterology  Screening mammogram for breast cancer  - Plan: MM 3D SCREENING MAMMOGRAM BILATERAL BREAST  Screening for diabetes mellitus - Plan: Comprehensive metabolic panel, Hemoglobin A1c  Vitamin D deficiency - Plan: VITAMIN D 25 Hydroxy (Vit-D Deficiency, Fractures)  Chronic cough - Plan: DG Chest 2 View, predniSONE (DELTASONE) 20 MG tablet  Patient seen today with concern of chronic cough, worse the last year or so.  Based on her history this could be cough variant asthma.  I will have her try a 5-day prednisone burst to see if this will help with her symptoms, she will let me know Also obtain chest x-ray today Referral to GI, she is overdue for colon cancer screening.  They may want to do an upper GI at the same time due to chronic cough Ordered mammogram Routine blood work is pending Blood pressure elevated; patient notes she  did not yet take her morning blood pressure medicine I have asked her to monitor her blood pressure at home and update me  Signed Abbe Amsterdam, MD  Received labs as below, message to patient  Results for orders placed or performed in visit on 06/21/23  CBC  Result Value Ref Range   WBC 6.2 4.0 - 10.5 K/uL   RBC 4.46 3.87 - 5.11 Mil/uL   Platelets 355.0 150.0 - 400.0 K/uL   Hemoglobin 11.6 (L) 12.0 - 15.0 g/dL   HCT 78.2 95.6 - 21.3 %   MCV 82.7 78.0 - 100.0 fl   MCHC 31.5 30.0 - 36.0 g/dL   RDW 08.6 (H) 57.8 - 46.9 %  Comprehensive metabolic panel  Result Value Ref Range   Sodium 137 135 - 145 mEq/L   Potassium 3.4 (L) 3.5 - 5.1 mEq/L   Chloride 104 96 - 112 mEq/L   CO2 26 19 - 32 mEq/L   Glucose, Bld 97 70 - 99 mg/dL   BUN 14 6 - 23 mg/dL   Creatinine, Ser 6.29 0.40 - 1.20 mg/dL   Total Bilirubin 0.8 0.2 - 1.2 mg/dL   Alkaline Phosphatase 70 39 - 117 U/L   AST 18 0 - 37 U/L   ALT 14 0 - 35 U/L   Total Protein 6.9 6.0 - 8.3 g/dL   Albumin 4.3 3.5 - 5.2 g/dL   GFR 52.84 >13.24 mL/min   Calcium 9.1 8.4 - 10.5 mg/dL  Hemoglobin M0N  Result Value Ref Range   Hgb A1c MFr Bld 5.7 4.6 - 6.5 %   Lipid panel  Result Value Ref Range   Cholesterol 169 0 - 200 mg/dL   Triglycerides 02.7 0.0 - 149.0 mg/dL   HDL 25.36 >64.40 mg/dL   VLDL 34.7 0.0 - 42.5 mg/dL   LDL Cholesterol 956 (H) 0 - 99 mg/dL   Total CHOL/HDL Ratio 4    NonHDL 123.30   VITAMIN D 25 Hydroxy (Vit-D Deficiency, Fractures)  Result Value Ref Range   VITD 35.02 30.00 - 100.00 ng/mL

## 2023-06-21 ENCOUNTER — Encounter: Payer: Self-pay | Admitting: Family Medicine

## 2023-06-21 ENCOUNTER — Ambulatory Visit (HOSPITAL_BASED_OUTPATIENT_CLINIC_OR_DEPARTMENT_OTHER)
Admission: RE | Admit: 2023-06-21 | Discharge: 2023-06-21 | Disposition: A | Discharge status: Home / Self Care | Payer: Managed Care, Other (non HMO) | Source: Ambulatory Visit | Attending: Family Medicine | Admitting: Family Medicine

## 2023-06-21 ENCOUNTER — Ambulatory Visit (INDEPENDENT_AMBULATORY_CARE_PROVIDER_SITE_OTHER): Payer: Managed Care, Other (non HMO) | Admitting: Family Medicine

## 2023-06-21 ENCOUNTER — Encounter: Payer: Self-pay | Admitting: Pediatrics

## 2023-06-21 VITALS — BP 150/100 | HR 80 | Temp 98.3°F | Resp 18 | Ht 60.0 in | Wt 180.6 lb

## 2023-06-21 DIAGNOSIS — Z131 Encounter for screening for diabetes mellitus: Secondary | ICD-10-CM

## 2023-06-21 DIAGNOSIS — Z13220 Encounter for screening for lipoid disorders: Secondary | ICD-10-CM

## 2023-06-21 DIAGNOSIS — E559 Vitamin D deficiency, unspecified: Secondary | ICD-10-CM

## 2023-06-21 DIAGNOSIS — R053 Chronic cough: Secondary | ICD-10-CM | POA: Insufficient documentation

## 2023-06-21 DIAGNOSIS — E0590 Thyrotoxicosis, unspecified without thyrotoxic crisis or storm: Secondary | ICD-10-CM

## 2023-06-21 DIAGNOSIS — I10 Essential (primary) hypertension: Secondary | ICD-10-CM

## 2023-06-21 DIAGNOSIS — Z1231 Encounter for screening mammogram for malignant neoplasm of breast: Secondary | ICD-10-CM

## 2023-06-21 DIAGNOSIS — Z1211 Encounter for screening for malignant neoplasm of colon: Secondary | ICD-10-CM

## 2023-06-21 LAB — VITAMIN D 25 HYDROXY (VIT D DEFICIENCY, FRACTURES): VITD: 35.02 ng/mL (ref 30.00–100.00)

## 2023-06-21 LAB — COMPREHENSIVE METABOLIC PANEL
ALT: 14 U/L (ref 0–35)
AST: 18 U/L (ref 0–37)
Albumin: 4.3 g/dL (ref 3.5–5.2)
Alkaline Phosphatase: 70 U/L (ref 39–117)
BUN: 14 mg/dL (ref 6–23)
CO2: 26 meq/L (ref 19–32)
Calcium: 9.1 mg/dL (ref 8.4–10.5)
Chloride: 104 meq/L (ref 96–112)
Creatinine, Ser: 0.79 mg/dL (ref 0.40–1.20)
GFR: 85.88 mL/min (ref >60.00)
Glucose, Bld: 97 mg/dL (ref 70–99)
Potassium: 3.4 meq/L — ABNORMAL LOW (ref 3.5–5.1)
Sodium: 137 meq/L (ref 135–145)
Total Bilirubin: 0.8 mg/dL (ref 0.2–1.2)
Total Protein: 6.9 g/dL (ref 6.0–8.3)

## 2023-06-21 LAB — LIPID PANEL
Cholesterol: 169 mg/dL (ref 0–200)
HDL: 45.7 mg/dL (ref >39.00)
LDL Cholesterol: 109 mg/dL — ABNORMAL HIGH (ref 0–99)
NonHDL: 123.3
Total CHOL/HDL Ratio: 4
Triglycerides: 72 mg/dL (ref 0.0–149.0)
VLDL: 14.4 mg/dL (ref 0.0–40.0)

## 2023-06-21 LAB — CBC
HCT: 36.9 % (ref 36.0–46.0)
Hemoglobin: 11.6 g/dL — ABNORMAL LOW (ref 12.0–15.0)
MCHC: 31.5 g/dL (ref 30.0–36.0)
MCV: 82.7 fL (ref 78.0–100.0)
Platelets: 355 10*3/uL (ref 150.0–400.0)
RBC: 4.46 Mil/uL (ref 3.87–5.11)
RDW: 17.4 % — ABNORMAL HIGH (ref 11.5–15.5)
WBC: 6.2 10*3/uL (ref 4.0–10.5)

## 2023-06-21 LAB — HEMOGLOBIN A1C: Hgb A1c MFr Bld: 5.7 % (ref 4.6–6.5)

## 2023-06-21 MED ORDER — PREDNISONE 20 MG PO TABS: ORAL_TABLET | ORAL | 0 refills | Status: DC

## 2023-06-21 NOTE — Patient Instructions (Addendum)
It was good to see you today!  Mammogram ordered Chest x-ray Labs Referral to GI Try prednisone 40 mg daily for 5 days- please let me know how this works, if it relieves your cough  Recommend- flu shot, covid booster, tetanus booster, shingles series   Please keep an eye on your BP at home- perhaps check every other day for 10 days or so.  If consistently higher than 135/85 we will want to make an adjustment

## 2023-07-03 ENCOUNTER — Encounter: Payer: Self-pay | Admitting: Family Medicine

## 2023-07-03 DIAGNOSIS — R053 Chronic cough: Secondary | ICD-10-CM

## 2023-07-04 MED ORDER — OMEPRAZOLE 20 MG PO CPDR: 20.0000 mg | DELAYED_RELEASE_CAPSULE | Freq: Every day | ORAL | 1 refills | Status: DC

## 2023-07-04 NOTE — Addendum Note (Signed)
Addended by: Abbe Amsterdam C on: 07/04/2023 12:14 PM   Modules accepted: Orders

## 2023-07-06 ENCOUNTER — Ambulatory Visit (HOSPITAL_BASED_OUTPATIENT_CLINIC_OR_DEPARTMENT_OTHER)
Admission: RE | Admit: 2023-07-06 | Discharge: 2023-07-06 | Disposition: A | Discharge status: Home / Self Care | Payer: Managed Care, Other (non HMO) | Source: Ambulatory Visit | Attending: Family Medicine | Admitting: Family Medicine

## 2023-07-06 ENCOUNTER — Ambulatory Visit (HOSPITAL_BASED_OUTPATIENT_CLINIC_OR_DEPARTMENT_OTHER): Admission: RE | Admit: 2023-07-06 | Payer: Managed Care, Other (non HMO) | Source: Ambulatory Visit

## 2023-07-06 ENCOUNTER — Encounter (HOSPITAL_BASED_OUTPATIENT_CLINIC_OR_DEPARTMENT_OTHER): Payer: Self-pay

## 2023-07-06 ENCOUNTER — Encounter: Payer: Self-pay | Admitting: Internal Medicine

## 2023-07-06 ENCOUNTER — Ambulatory Visit (INDEPENDENT_AMBULATORY_CARE_PROVIDER_SITE_OTHER): Payer: Managed Care, Other (non HMO) | Admitting: Internal Medicine

## 2023-07-06 VITALS — BP 138/72 | HR 85 | Ht 60.0 in | Wt 182.0 lb

## 2023-07-06 DIAGNOSIS — E0590 Thyrotoxicosis, unspecified without thyrotoxic crisis or storm: Secondary | ICD-10-CM | POA: Diagnosis not present

## 2023-07-06 DIAGNOSIS — Z1231 Encounter for screening mammogram for malignant neoplasm of breast: Secondary | ICD-10-CM | POA: Insufficient documentation

## 2023-07-06 DIAGNOSIS — E0500 Thyrotoxicosis with diffuse goiter without thyrotoxic crisis or storm: Secondary | ICD-10-CM | POA: Diagnosis not present

## 2023-07-06 NOTE — Progress Notes (Signed)
Name: Terri [name]  MRN/ DOB: [id number], [birth year]    Age/ Sex: 52 y.o., female     PCP: Copland, Gwenlyn Found, MD   Reason for Endocrinology Evaluation: Hyperthyroidism     Initial Endocrinology Clinic Visit: 01/06/2021    PATIENT IDENTIFIER: [name] is a 52 y.o., female with a past medical history of HTN and hyperthyroidism. She has followed with  Endocrinology clinic since 01/06/2021 for consultative assistance with management of her Subclinical Hyperthyroidism.   HISTORICAL SUMMARY:   Pt has been noted to have a suppressed TSH in 10/2019 at < 0.01 uIU/mL  with normal FT4, during routine work up.    She has been started on Methimazole in 10/2019 due to symptoms of hyperthyroidism.      No FH of thyroid disease      SUBJECTIVE:     Today (07/06/2023):  Terri [name] is here for  hyperthyroidism.  Weight continues to fluctuate  Has occasional constipation but no diarrhea  Denies palpitations or tremors  Denies local neck symptoms  Denies eye burning or itching   Continues with chronic cough , antihistamine, PPIs and prednisone didn't help   Methimazole 5 mg, 1 tablet Monday through Thursday     HISTORY:  Past Medical History:  Past Medical History:  Diagnosis Date   Abnormal Pap smear of cervix    cryotherapy   Hypertension    Migraine    Past Surgical History:  Past Surgical History:  Procedure Laterality Date   LAPAROSCOPIC BILATERAL SALPINGECTOMY Bilateral 03/18/2014   Procedure: LAPAROSCOPIC BILATERAL SALPINGECTOMY;  Surgeon: Allie Bossier, MD;  Location: [facility];  Service: Gynecology;  Laterality: Bilateral;   LAPAROSCOPIC LYSIS OF ADHESIONS Bilateral 03/18/2014   Procedure: LAPAROSCOPIC LYSIS OF ADHESIONS;  Surgeon: Allie Bossier, MD;  Location: [facility];  Service: Gynecology;  Laterality: Bilateral;   Social History:  reports that she has never smoked. She has never used smokeless tobacco. She reports that she does not drink alcohol  and does not use drugs. Family History:  Family History  Problem Relation Age of Onset   Hypertension Mother    Cancer Maternal Aunt 20       colon Cancer   Cancer Paternal Aunt 49       brest cancer   Heart disease Maternal Grandmother    Stroke Maternal Grandmother    Stroke Paternal Grandmother      HOME MEDICATIONS: Allergies as of 07/06/2023   No Known Allergies      Medication List        Accurate as of July 06, 2023  7:31 AM. If you have any questions, ask your nurse or doctor.          STOP taking these medications    predniSONE 20 MG tablet Commonly known as: DELTASONE Stopped by: Johnney Ou Hilbert Briggs       TAKE these medications    amLODipine 2.5 MG tablet Commonly known as: NORVASC Take 1 tablet (2.5 mg total) by mouth daily.   calcium-vitamin D 250-125 MG-UNIT tablet Commonly known as: OSCAL WITH D Take 1 tablet by mouth daily.   methimazole 5 MG tablet Commonly known as: TAPAZOLE Take 1 tablet (5 mg total) by mouth as directed. 1 tablet Monday through Thursday, none the rest of the week   metoprolol tartrate 50 MG tablet Commonly known as: LOPRESSOR TAKE 1 TABLET BY MOUTH TWICE DAILY .   omeprazole 20 MG capsule Commonly known as: PRILOSEC Take  1 capsule (20 mg total) by mouth daily.        DATA REVIEWED:  Latest Reference Range & Units 06/21/23 08:41  Sodium 135 - 145 mEq/L 137  Potassium 3.5 - 5.1 mEq/L 3.4 (L)  Chloride 96 - 112 mEq/L 104  CO2 19 - 32 mEq/L 26  Glucose 70 - 99 mg/dL 97  BUN 6 - 23 mg/dL 14  Creatinine 4.69 - 6.29 mg/dL 5.28  Calcium 8.4 - 41.3 mg/dL 9.1  Alkaline Phosphatase 39 - 117 U/L 70  Albumin 3.5 - 5.2 g/dL 4.3  AST 0 - 37 U/L 18  ALT 0 - 35 U/L 14  Total Protein 6.0 - 8.3 g/dL 6.9  Total Bilirubin 0.2 - 1.2 mg/dL 0.8  GFR >24.40 mL/min 85.88    Latest Reference Range & Units 06/21/23 08:41  WBC 4.0 - 10.5 K/uL 6.2  RBC 3.87 - 5.11 Mil/uL 4.46  Hemoglobin 12.0 - 15.0 g/dL 10.2 (L)  HCT  72.5 - 36.6 % 36.9  MCV 78.0 - 100.0 fl 82.7  MCHC 30.0 - 36.0 g/dL 44.0  RDW 34.7 - 42.5 % 17.4 (H)  Platelets 150.0 - 400.0 K/uL 355.0     Latest Reference Range & Units 07/06/23 07:50  TSH mIU/L 0.45  T4,Free(Direct) 0.8 - 1.8 ng/dL 0.8      Results for Terri [name], Terri [name] (MRN [id number]) as of 03/23/2021 07:30  Ref. Range 01/07/2020 08:30  TRAB Latest Ref Range: <=2.00 IU/L 8.93 (H)    ASSESSMENT / PLAN / RECOMMENDATIONS:   Hyperthyroidism Secondary to Graves' Disease:   - Pt is clinically euthyroid  - No local neck symptoms  -TFTs remain within normal range -No change  Medications  methimazole 5 mg 1 tablet Monday through Thursday, none the rest of the week  2. Graves' Disease:  - No extra thyroidal manifestations of Graves' disease   F/U in 6 months   Signed electronically by: Lyndle Herrlich, MD  [facility]  [facility] [address] [place] [place], [place] [address] Phone: [phone] FAX: [phone]      CC: Pearline Cables, MD [address] [place] [address] Phone: [phone]  Fax: [phone]   Return to Endocrinology clinic as below: Future Appointments  Date Time Provider Department Center  07/06/2023 11:00 AM [facility]  08/07/2023  8:30 AM McGreal, Durene Romans, MD [facility]

## 2023-07-07 LAB — T4, FREE: Free T4: 0.8 ng/dL (ref 0.8–1.8)

## 2023-07-07 LAB — TSH: TSH: 0.45 m[IU]/L

## 2023-07-07 MED ORDER — METHIMAZOLE 5 MG PO TABS: 5.0000 mg | ORAL_TABLET | ORAL | 2 refills | Status: DC

## 2023-07-10 ENCOUNTER — Other Ambulatory Visit: Payer: Self-pay | Admitting: Family Medicine

## 2023-07-10 DIAGNOSIS — R928 Other abnormal and inconclusive findings on diagnostic imaging of breast: Secondary | ICD-10-CM

## 2023-07-22 NOTE — Addendum Note (Signed)
Addended by: Abbe Amsterdam C on: 07/22/2023 08:30 AM   Modules accepted: Orders

## 2023-07-28 ENCOUNTER — Other Ambulatory Visit: Payer: Self-pay | Admitting: Family Medicine

## 2023-07-28 DIAGNOSIS — I10 Essential (primary) hypertension: Secondary | ICD-10-CM

## 2023-07-31 ENCOUNTER — Ambulatory Visit
Admission: RE | Admit: 2023-07-31 | Discharge: 2023-07-31 | Disposition: A | Discharge status: Home / Self Care | Payer: Managed Care, Other (non HMO) | Source: Ambulatory Visit | Attending: Family Medicine | Admitting: Family Medicine

## 2023-07-31 ENCOUNTER — Ambulatory Visit: Payer: Managed Care, Other (non HMO)

## 2023-07-31 DIAGNOSIS — R928 Other abnormal and inconclusive findings on diagnostic imaging of breast: Secondary | ICD-10-CM

## 2023-08-04 NOTE — Progress Notes (Addendum)
 [facility] Initial Consultation   Referring Provider Terri [name], Terri BROCKS, MD [address] [place],  [place] [address]  Primary Care Provider Terri [name], Terri BROCKS, MD  Patient Profile: Terri Terri [name] is a 53 y.o. female who is seen in consultation in the [facility] at the request of Dr. Watt for Terri [name] and management of the problem(s) noted below.  Problem List: Chronic Terri [name] Constipation Mild anemia Colon cancer screening   History of Present Illness   Terri Terri [name] is a 53 y.o. female with a history of hypertension, Graves' disease, migraines and overweight body habitus who presents to the office to discuss colon cancer screening, constipation and chronic Terri [name] -GERD versus allergies.  Chronic Terri [name] Reports mostly dry, chronic Terri [name] for last couple of years and worsening last 6 months -occasionally will Terri [name] with mucus Symptoms exacerbated by certain smells and at nighttime Describes feeling a tickle in the back of her throat Sometimes she coughs so much that it brings tears to her eyes No hemoptysis or hematemesis  Denies a prior history of GERD, pyrosis, heartburn No dysphagia or odynophagia On omeprazole  20 mg orally daily x 4 weeks with mild reduction in symptoms No previous EGD  Constipation Notes onset of constipation after diagnosis of Graves' disease in 2019-2020 Previously defecated on a regular basis -movements 2 times per week Endorses straining to defecate No blood or mucus in stool Denies abdominal pain or cramping Treating symptoms with diet by eating high-fiber foods -has not needed to use OTC laxatives  Colon cancer screening No previous colonoscopy No family history of colorectal cancer As above noted change in bowel habits with constipation after diagnosis of Graves' disease  Last colonoscopy: None Last endoscopy: None  Last Abd CT/CTE/MRE: None  GI Review of Symptoms Significant for: Change  in bowel habits, constipation. Otherwise negative.  General Review of Systems  Review of systems is significant for the pertinent positives and negatives as listed per the HPI.  Full ROS is otherwise negative.  Patient also notes symptoms of back pain, fatigue, headaches, menstrual pain, muscle pain, sleeping problems and excessive urination  Past Medical History   Patient Active Problem List   Diagnosis Date Noted   Graves disease 03/23/2021   Subclinical hyperthyroidism 01/07/2020   Terri [name] 07/15/2019   Abnormal abdominal CT scan  see 2015 scan  referral to GYN made 08/03/2014   Vitamin D  deficiency 10/15/2012   Essential hypertension, benign 09/24/2012   History of migraine headaches 09/24/2012   Overweight 09/24/2012    Past Surgical History   Past Surgical History:  Procedure Laterality Date   LAPAROSCOPIC BILATERAL SALPINGECTOMY Bilateral 03/18/2014   Procedure: LAPAROSCOPIC BILATERAL SALPINGECTOMY;  Surgeon: Harland BROCKS Birkenhead, MD;  Location: [facility];  Service: Gynecology;  Laterality: Bilateral;   LAPAROSCOPIC LYSIS OF ADHESIONS Bilateral 03/18/2014   Procedure: LAPAROSCOPIC LYSIS OF ADHESIONS;  Surgeon: Harland BROCKS Birkenhead, MD;  Location: [facility];  Service: Gynecology;  Laterality: Bilateral;      Allergies and Medications  No Known Allergies   Current Outpatient Medications:    amLODipine  (NORVASC ) 2.5 MG tablet, Take 1 tablet by mouth once daily, Disp: 90 tablet, Rfl: 1   calcium-vitamin D  (OSCAL WITH D) 250-125 MG-UNIT tablet, Take 1 tablet by mouth daily., Disp: , Rfl:    methimazole  (TAPAZOLE ) 5 MG tablet, Take 1 tablet (5 mg total) by mouth as directed. 1 tablet Monday through Thursday, none the rest of the week, Disp: 48 tablet, Rfl: 2   metoprolol  tartrate (  LOPRESSOR ) 50 MG tablet, Take 1 tablet by mouth twice daily, Disp: 180 tablet, Rfl: 1   omeprazole  (PRILOSEC) 20 MG capsule, Take 1 capsule (20 mg total) by mouth daily., Disp: 30 capsule, Rfl: 1   Na Sulfate-K Sulfate-Mg Sulf  17.5-3.13-1.6 GM/177ML SOLN, Take 1 kit by mouth once for 1 dose., Disp: 354 mL, Rfl: 0   Family History  Heart disease -MI Hypertension Breast cancer-paternal aunt Ovarian cancer  GI Specific Family History: None  Social History   Social History   Social History Narrative   Single   Has 1 adult child   Employed as a librarian, academic   No tobacco, alcohol or illicit drugs   Terri Terri [name] reports that she has never smoked. She has never used smokeless tobacco. She reports that she does not drink alcohol and does not use drugs.  Vital Signs and Physical Examination   Vitals:   08/07/23 0826 08/07/23 0858  BP: (!) 140/84 (!) 148/82  Pulse: 72   SpO2: 97%    Body mass index is 35.94 kg/m. Weight: 184 lb (83.5 kg)  Constitutional: healthy appearing nontoxic appearing 52 y.o. female in no apparent distress.   Eyes: Anicteric sclerae Head: Normocephalic/atraumatic Neck: Normal appearing without masses Pulmonary: Clear to auscultation bilaterally CV: S1, S2 regular rate and rhythm GI/Abdomen: Soft, nontender, nondistended, normoactive bowel sounds Anorectal: Deferred Neuro: Grossly intact Skin: Normal Bilateral Extremities: No edema   Review of Data  The following data was reviewed at the time of this encounter:  Laboratory Studies   Lab Results  Component Value Date   WBC 6.2 06/21/2023   HGB 11.6 (L) 06/21/2023   HCT 36.9 06/21/2023   MCV 82.7 06/21/2023   PLT 355.0 06/21/2023     Chemistry      Component Value Date/Time   NA 137 06/21/2023 0841   K 3.4 (L) 06/21/2023 0841   CL 104 06/21/2023 0841   CO2 26 06/21/2023 0841   BUN 14 06/21/2023 0841   CREATININE 0.79 06/21/2023 0841   CREATININE 0.92 05/15/2014 1541      Component Value Date/Time   CALCIUM 9.1 06/21/2023 0841   ALKPHOS 70 06/21/2023 0841   AST 18 06/21/2023 0841   ALT 14 06/21/2023 0841   BILITOT 0.8 06/21/2023 0841      Lab Results  Component Value Date   TSH 0.45  07/06/2023    Imaging Studies  AUS 01/05/2021 EXAM: ABDOMEN ULTRASOUND COMPLETE   COMPARISON:  CT abdomen and pelvis 07/05/2014.   FINDINGS: Gallbladder: No gallstones or wall thickening visualized. No sonographic Murphy sign noted by sonographer.   Common bile duct: Diameter: 0.4 cm   Liver: A single 1.1 cm calcification in the right lobe is likely due to some prior infectious or inflammatory process and was present on the prior CT. No worrisome lesion. Within normal limits in parenchymal echogenicity. Portal vein is patent on color Doppler imaging with normal direction of blood flow towards the liver.   IVC: No abnormality visualized.   Pancreas: Visualized portion unremarkable.   Spleen: Size and appearance within normal limits.   Right Kidney: Length: 9.3 cm. Echogenicity within normal limits. No mass or hydronephrosis visualized.   Left Kidney: Length: 10.7 cm. Echogenicity within normal limits. No mass or hydronephrosis visualized.   Abdominal aorta: No aneurysm visualized.   Other findings: There is a focus of intermediate to decreased echogenicity in the left upper quadrant of the abdomen anterior to the spleen measuring 4.0 x 2.1 x  3.6 cm. There is no flow within this lesion on Doppler imaging. In retrospect, this lesion is present on the prior CT scan where it appears to extend out of the oblique musculature and left chest wall. It is unchanged in size.   IMPRESSION: Negative for gallstones. No acute abnormality or finding to explain the patient's symptoms.   Nonspecific lesion in the left upper quadrant the abdomen is unchanged since 2015 and consistent with a benign entity such as an old hematoma or ganglion cyst.    GI Procedures and Studies  None   Clinical Impression  It is my clinical impression that Terri Terri [name] is a 53 y.o. female with;  Chronic Terri [name]-possible GERD 2.   Constipation 3.   Mild anemia 4.   Colon cancer screening  Ms.  Terri [name] presents with symptoms of chronic Terri [name] and consideration of GERD as a possible etiology.  Reviewed that high-grade reflux can contribute to GERD in some patients.  She is not endorsing typical symptoms of heartburn or pyrosis but has noted mild improvement in her symptoms on omeprazole  20 mg orally daily.  Discussed proceeding with upper endoscopy at the time of her future colonoscopy.  If this is unrevealing can Terri [name] pH and impedance testing.  Terri Terri [name].  She has noted a change in bowel habits with constipation and straining since her diagnosis of Graves' disease.  No blood or mucus in her stool but her recent labs have shown mild anemia over the last 2 years.  No family history of colorectal cancer.  Her change in bowel habits may be related to her underlying thyroid  disorder but would be prudent to rule out structural abnormalities. She is amenable to proceeding with colonoscopy at the same time as her upper endoscopy.  Reviewed risks and benefits of endoscopic Terri [name].  Plan  Schedule diagnostic EGD for Terri [name] of GERD and chronic Terri [name] Schedule screening colonoscopy at [facility] as patient is now due; incidental notation of change in bowel habits, chronic constipation, mild anemia Continue omeprazole  20 mg orally daily for now. Further recommendations pending EGD and colonoscopy  Planned Follow Up TBD pending EGD/colonoscopy results  The patient or caregiver verbalized understanding of the material covered, with no barriers to understanding. All questions were answered. Patient or caregiver is agreeable with the plan outlined above.    It was a pleasure to see Laiklynn.  If you have any questions or concerns regarding this evaluation, do not hesitate to contact me.   Inocente Hausen, MD [facility]  I spent a total of 45 minutes in both face-to-face and non-face-to-face  activities, including procedures performed, for the visit on the date of this encounter.

## 2023-08-07 ENCOUNTER — Encounter: Payer: Self-pay | Admitting: Pediatrics

## 2023-08-07 ENCOUNTER — Ambulatory Visit: Payer: Managed Care, Other (non HMO) | Admitting: Pediatrics

## 2023-08-07 VITALS — BP 148/82 | HR 72 | Ht 60.0 in | Wt 184.0 lb

## 2023-08-07 DIAGNOSIS — K219 Gastro-esophageal reflux disease without esophagitis: Secondary | ICD-10-CM

## 2023-08-07 DIAGNOSIS — K5900 Constipation, unspecified: Secondary | ICD-10-CM | POA: Diagnosis not present

## 2023-08-07 DIAGNOSIS — R053 Chronic cough: Secondary | ICD-10-CM

## 2023-08-07 DIAGNOSIS — D649 Anemia, unspecified: Secondary | ICD-10-CM

## 2023-08-07 DIAGNOSIS — Z1211 Encounter for screening for malignant neoplasm of colon: Secondary | ICD-10-CM

## 2023-08-07 DIAGNOSIS — R059 Cough, unspecified: Secondary | ICD-10-CM

## 2023-08-07 MED ORDER — NA SULFATE-K SULFATE-MG SULF 17.5-3.13-1.6 GM/177ML PO SOLN: 1.0000 | Freq: Once | ORAL | 0 refills | Status: AC

## 2023-08-07 MED ORDER — NA SULFATE-K SULFATE-MG SULF 17.5-3.13-1.6 GM/177ML PO SOLN: 1.0000 | Freq: Once | ORAL | 0 refills | Status: DC

## 2023-08-07 NOTE — Patient Instructions (Signed)
 You have been scheduled for an endoscopy and colonoscopy. Please follow the written instructions given to you at your visit today.  Please pick up your prep supplies at the pharmacy within the next 1-3 days.  If you use inhalers (even only as needed), please bring them with you on the day of your procedure.  DO NOT TAKE 7 DAYS PRIOR TO TEST- Trulicity (dulaglutide) Ozempic, Wegovy (semaglutide) Mounjaro (tirzepatide) Bydureon Bcise (exanatide extended release)  DO NOT TAKE 1 DAY PRIOR TO YOUR TEST Rybelsus (semaglutide) Adlyxin (lixisenatide) Victoza (liraglutide) Byetta (exanatide)   If your blood pressure at your visit was 140/90 or greater, please contact your primary care physician to follow up on this.  _______________________________________________________  If you are age 57 or older, your body mass index should be between 23-30. Your Body mass index is 35.94 kg/m. If this is out of the aforementioned range listed, please consider follow up with your Primary Care Provider.  If you are age 48 or younger, your body mass index should be between 19-25. Your Body mass index is 35.94 kg/m. If this is out of the aformentioned range listed, please consider follow up with your Primary Care Provider.   ________________________________________________________  The [facility] providers would like to encourage you to use MYCHART to communicate with providers for non-urgent requests or questions.  Due to long hold times on the telephone, sending your provider a message by [facility] may be a faster and more efficient way to get a response.  Please allow 48 business hours for a response.  Please remember that this is for non-urgent requests.  _______________________________________________________  .

## 2023-08-13 ENCOUNTER — Encounter: Payer: Self-pay | Admitting: Certified Registered Nurse Anesthetist

## 2023-08-18 ENCOUNTER — Ambulatory Visit (AMBULATORY_SURGERY_CENTER): Payer: Managed Care, Other (non HMO) | Admitting: Pediatrics

## 2023-08-18 ENCOUNTER — Encounter: Payer: Self-pay | Admitting: Pediatrics

## 2023-08-18 VITALS — BP 165/98 | HR 104 | Temp 97.5°F | Resp 16 | Ht 60.0 in | Wt 184.0 lb

## 2023-08-18 DIAGNOSIS — K648 Other hemorrhoids: Secondary | ICD-10-CM

## 2023-08-18 DIAGNOSIS — Z1211 Encounter for screening for malignant neoplasm of colon: Secondary | ICD-10-CM | POA: Diagnosis present

## 2023-08-18 DIAGNOSIS — K644 Residual hemorrhoidal skin tags: Secondary | ICD-10-CM | POA: Diagnosis not present

## 2023-08-18 DIAGNOSIS — R053 Chronic cough: Secondary | ICD-10-CM

## 2023-08-18 DIAGNOSIS — K5909 Other constipation: Secondary | ICD-10-CM | POA: Diagnosis not present

## 2023-08-18 DIAGNOSIS — K219 Gastro-esophageal reflux disease without esophagitis: Secondary | ICD-10-CM

## 2023-08-18 MED ORDER — SODIUM CHLORIDE 0.9 % IV SOLN: 500.0000 mL | INTRAVENOUS | Status: DC

## 2023-08-18 NOTE — Progress Notes (Unsigned)
1550 HR > 100 with esmolol 25 mg given IV, MD updated, vss  ?

## 2023-08-18 NOTE — Progress Notes (Unsigned)
Report given to PACU, vss 

## 2023-08-18 NOTE — Progress Notes (Unsigned)
1545 BP 174/107, Labetalol given IV, MD update, vss

## 2023-08-18 NOTE — Progress Notes (Unsigned)
1605 HR > 100 with esmolol 25 mg given IV, MD updated, vss

## 2023-08-18 NOTE — Progress Notes (Unsigned)
[facility] History and Physical   Primary Care Physician:  Pearline Cables, MD   Reason for Procedure:  Chronic cough -rule out GERD, average risk screening colonoscopy  Plan:    Diagnostic EGD and average risk screening colonoscopy     HPI: Terri [name] is a 53 y.o. female undergoing diagnostic upper endoscopy for evaluation of possible GERD.  Patient reports a dry, chronic cough for the last 6 months without classic symptoms of GERD.  Has been treated with omeprazole 20 mg orally daily x 4 weeks with mild reduction in symptoms.  Patient is due for average risk screening colonoscopy.  No known family history of colorectal cancer.  She has had chronic constipation which began after diagnosis of Graves' disease.  Incidental notation of mild anemia on labs -CBC 06/21/2023 showed hemoglobin 11.6   Past Medical History:  Diagnosis Date   Abnormal Pap smear of cervix    cryotherapy   Graves disease    Hypertension    Migraine     Past Surgical History:  Procedure Laterality Date   LAPAROSCOPIC BILATERAL SALPINGECTOMY Bilateral 03/18/2014   Procedure: LAPAROSCOPIC BILATERAL SALPINGECTOMY;  Surgeon: Allie Bossier, MD;  Location: [facility];  Service: Gynecology;  Laterality: Bilateral;   LAPAROSCOPIC LYSIS OF ADHESIONS Bilateral 03/18/2014   Procedure: LAPAROSCOPIC LYSIS OF ADHESIONS;  Surgeon: Allie Bossier, MD;  Location: [facility];  Service: Gynecology;  Laterality: Bilateral;    Prior to Admission medications   Medication Sig Start Date End Date Taking? Authorizing Provider  amLODipine (NORVASC) 2.5 MG tablet Take 1 tablet by mouth once daily 07/28/23   Copland, Gwenlyn Found, MD  calcium-vitamin D (OSCAL WITH D) 250-125 MG-UNIT tablet Take 1 tablet by mouth daily.    [provider]  methimazole (TAPAZOLE) 5 MG tablet Take 1 tablet (5 mg total) by mouth as directed. 1 tablet Monday through Thursday, none the rest of the week 07/07/23   Shamleffer, Konrad Dolores, MD   metoprolol tartrate (LOPRESSOR) 50 MG tablet Take 1 tablet by mouth twice daily 07/28/23   Copland, Gwenlyn Found, MD  omeprazole (PRILOSEC) 20 MG capsule Take 1 capsule (20 mg total) by mouth daily. 07/04/23   Copland, Gwenlyn Found, MD    Current Outpatient Medications  Medication Sig Dispense Refill   amLODipine (NORVASC) 2.5 MG tablet Take 1 tablet by mouth once daily 90 tablet 1   calcium-vitamin D (OSCAL WITH D) 250-125 MG-UNIT tablet Take 1 tablet by mouth daily.     methimazole (TAPAZOLE) 5 MG tablet Take 1 tablet (5 mg total) by mouth as directed. 1 tablet Monday through Thursday, none the rest of the week 48 tablet 2   metoprolol tartrate (LOPRESSOR) 50 MG tablet Take 1 tablet by mouth twice daily 180 tablet 1   omeprazole (PRILOSEC) 20 MG capsule Take 1 capsule (20 mg total) by mouth daily. 30 capsule 1   Current Facility-Administered Medications  Medication Dose Route Frequency Provider Last Rate Last Admin   0.9 %  sodium chloride infusion  500 mL Intravenous Continuous Rease Swinson, Durene Romans, MD        Allergies as of 08/18/2023   (No Known Allergies)    Family History  Problem Relation Age of Onset   Hypertension Mother    Cancer Maternal Aunt 88       colon Cancer   Cancer Paternal Aunt 44       brest cancer   Heart disease Maternal Grandmother    Stroke Maternal Grandmother  Stroke Paternal Grandmother    Colon cancer Neg Hx    Colon polyps Neg Hx    Rectal cancer Neg Hx    Stomach cancer Neg Hx    Esophageal cancer Neg Hx     Social History   Socioeconomic History   Marital status: Single    Spouse name: Not on file   Number of children: Not on file   Years of education: Not on file   Highest education level: Not on file  Occupational History   Occupation: Admissions Service Associate  Tobacco Use   Smoking status: Never   Smokeless tobacco: Never  Substance and Sexual Activity   Alcohol use: No   Drug use: No   Sexual activity: Yes    Birth  control/protection: Condom  Other Topics Concern   Not on file  Social History Narrative   Single   Has 1 adult child   Employed as a Librarian, academic   No tobacco, alcohol or illicit drugs   Social Drivers of Corporate investment banker Strain: Not on file  Food Insecurity: Not on file  Transportation Needs: Not on file  Physical Activity: Not on file  Stress: Not on file  Social Connections: Not on file  Intimate Partner Violence: Not on file    Review of Systems:  All other review of systems negative except as mentioned in the HPI.  Physical Exam: Vital signs BP (!) 147/78   Pulse (!) 107   Temp (!) 97.5 F (36.4 C) (Temporal)   Resp 19   Ht 5' (1.524 m)   Wt 184 lb (83.5 kg)   LMP 07/31/2023 (Exact Date)   SpO2 100%   BMI 35.94 kg/m   General:   Alert,  Well-developed, well-nourished, pleasant and cooperative in NAD Airway:  Mallampati 1 Lungs:  Clear throughout to auscultation.   Heart:  Regular rate and rhythm; no murmurs, clicks, rubs,  or gallops. Abdomen:  Soft, nontender and nondistended. Normal bowel sounds.   Neuro/Psych:  Normal mood and affect. A and O x 3  Maren Beach, MD [facility]

## 2023-08-18 NOTE — Progress Notes (Unsigned)
1600  BP 155/104, Labetalol given IV, MD update, vss

## 2023-08-18 NOTE — Op Note (Signed)
[facility] Patient Name: Terri [name] Procedure Date: 08/18/2023 3:36 PM MRN: [id number] Endoscopist: Maren Beach , MD,  Age: 53 Referring MD:  Date of Birth: 24-Mar-1971 Gender: Female Account #: [id number] Procedure:                Colonoscopy Indications:              Incidental constipation noted Medicines:                Monitored Anesthesia Care Procedure:                Pre-Anesthesia Assessment:                           - Prior to the procedure, a History and Physical                            was performed, and patient medications and                            allergies were reviewed. The patient's tolerance of                            previous anesthesia was also reviewed. The risks                            and benefits of the procedure and the sedation                            options and risks were discussed with the patient.                            All questions were answered, and informed consent                            was obtained. Prior Anticoagulants: The patient has                            taken no anticoagulant or antiplatelet agents. ASA                            Grade Assessment: II - A patient with mild systemic                            disease. After reviewing the risks and benefits,                            the patient was deemed in satisfactory condition to                            undergo the procedure.                           After obtaining informed consent, the colonoscope  was passed under direct vision. Throughout the                            procedure, the patient's blood pressure, pulse, and                            oxygen saturations were monitored continuously. The                            Olympus Scope SN: [id number] was introduced through                            the anus and advanced to the cecum, identified by                            appendiceal orifice and ileocecal  valve. The                            colonoscopy was somewhat difficult due to a                            tortuous colon. Successful completion of the                            procedure was aided by straightening and shortening                            the scope to obtain bowel loop reduction. The                            patient tolerated the procedure well. The quality                            of the bowel preparation was evaluated using the                            BBPS [facility] Bowel Preparation Scale) with scores                            of: Right Colon = 3, Transverse Colon = 3 and Left                            Colon = 3 (entire mucosa seen well with no residual                            staining, small fragments of stool or opaque                            liquid). The total BBPS score equals 9. Scope In: 3:52:18 PM Scope Out: 4:09:11 PM Scope Withdrawal Time: 0 hours 7 minutes 24 seconds  Total Procedure Duration: 0 hours 16 minutes 53 seconds  Findings:  Skin tags were found on perianal exam.                           The digital rectal exam was normal. Pertinent                            negatives include normal sphincter tone and no                            palpable rectal lesions.                           The colon (entire examined portion) appeared normal.                           Internal hemorrhoids were found during retroflexion. Complications:            No immediate complications. Estimated blood loss:                            None. Estimated Blood Loss:     Estimated blood loss: none. Impression:               - Perianal skin tags found on perianal exam.                           - The entire examined colon is normal.                           - Internal hemorrhoids.                           - No specimens collected. Recommendation:           - Discharge patient to home (ambulatory).                           - Repeat  colonoscopy in 10 years for screening                            purposes.                           - The findings and recommendations were discussed                            with the patient's family.                           - Return to referring physician.                           - Patient has a contact number available for                            emergencies. The signs and symptoms of potential  delayed complications were discussed with the                            patient. Return to normal activities tomorrow.                            Written discharge instructions were provided to the                            patient. Maren Beach, MD 08/18/2023 4:16:39 PM

## 2023-08-18 NOTE — Op Note (Signed)
[facility] Patient Name: Terri [name] Procedure Date: 08/18/2023 3:36 PM MRN: [id number] Endoscopist: Maren Beach , MD,  Age: 53 Referring MD:  Date of Birth: [birth year] Gender: Female Account #: [id number] Procedure:                Upper GI endoscopy Indications:              Suspected gastro-esophageal reflux disease, Chronic                            cough Medicines:                Monitored Anesthesia Care Procedure:                Pre-Anesthesia Assessment:                           - Prior to the procedure, a History and Physical                            was performed, and patient medications and                            allergies were reviewed. The patient's tolerance of                            previous anesthesia was also reviewed. The risks                            and benefits of the procedure and the sedation                            options and risks were discussed with the patient.                            All questions were answered, and informed consent                            was obtained. Prior Anticoagulants: The patient has                            taken no anticoagulant or antiplatelet agents. ASA                            Grade Assessment: II - A patient with mild systemic                            disease. After reviewing the risks and benefits,                            the patient was deemed in satisfactory condition to                            undergo the procedure.  After obtaining informed consent, the endoscope was                            passed under direct vision. Throughout the                            procedure, the patient's blood pressure, pulse, and                            oxygen saturations were monitored continuously. The                            GIF [id number] #[id number] was introduced through the                            mouth, and advanced to the second part of duodenum.                             The upper GI endoscopy was accomplished without                            difficulty. The patient tolerated the procedure                            well. Scope In: Scope Out: Findings:                 The examined esophagus was normal.                           The gastric body, gastric antrum, cardia (on                            retroflexion) and gastric fundus (on retroflexion)                            were normal.                           The duodenal bulb and second portion of the                            duodenum were normal. Complications:            No immediate complications. Estimated blood loss:                            None. Estimated Blood Loss:     Estimated blood loss: none. Impression:               - Normal esophagus. No endoscopic evidence of acid                            reflux.                           - Normal gastric body, antrum, cardia and gastric  fundus.                           - Normal duodenal bulb and second portion of the                            duodenum.                           - No specimens collected. Recommendation:           - If GERD contributing to cough remains a concern                            can consider pH/impedance testing.                           - Perform a colonoscopy today.                           - The findings and recommendations were discussed                            with the patient's family.                           - Return to GI clinic as previously scheduled.                           - Patient has a contact number available for                            emergencies. The signs and symptoms of potential                            delayed complications were discussed with the                            patient. Return to normal activities tomorrow.                            Written discharge instructions were provided to the                             patient. Maren Beach, MD 08/18/2023 4:13:59 PM

## 2023-08-18 NOTE — Patient Instructions (Addendum)
Resume all of your previous medications today as ordered.  Read a your discharge instructions.  YOU HAD AN ENDOSCOPIC PROCEDURE TODAY AT THE [facility]:   Refer to the procedure report that was given to you for any specific questions about what was found during the examination.  If the procedure report does not answer your questions, please call your gastroenterologist to clarify.  If you requested that your care partner not be given the details of your procedure findings, then the procedure report has been included in a sealed envelope for you to review at your convenience later.  YOU SHOULD EXPECT: Some feelings of bloating in the abdomen. Passage of more gas than usual.  Walking can help get rid of the air that was put into your GI tract during the procedure and reduce the bloating. If you had a lower endoscopy (such as a colonoscopy or flexible sigmoidoscopy) you may notice spotting of blood in your stool or on the toilet paper. If you underwent a bowel prep for your procedure, you may not have a normal bowel movement for a few days.  Please Note:  You might notice some irritation and congestion in your nose or some drainage.  This is from the oxygen used during your procedure.  There is no need for concern and it should clear up in a day or so.  SYMPTOMS TO REPORT IMMEDIATELY:  Following lower endoscopy (colonoscopy or flexible sigmoidoscopy):  Excessive amounts of blood in the stool  Significant tenderness or worsening of abdominal pains  Swelling of the abdomen that is new, acute  Fever of 100F or higher  Following upper endoscopy (EGD)  Vomiting of blood or coffee ground material  New chest pain or pain under the shoulder blades  Painful or persistently difficult swallowing  New shortness of breath  Fever of 100F or higher  Black, tarry-looking stools  For urgent or emergent issues, a gastroenterologist can be reached at any hour by calling [phone]. Do not use  MyChart messaging for urgent concerns.    DIET:  We do recommend a small meal at first, but then you may proceed to your regular diet.  Drink plenty of fluids but you should avoid alcoholic beverages for 24 hours.  ACTIVITY:  You should plan to take it easy for the rest of today and you should NOT DRIVE or use heavy machinery until tomorrow (because of the sedation medicines used during the test).    FOLLOW UP: Our staff will call the number listed on your records the next business day following your procedure.  We will call around 7:15- 8:00 am to check on you and address any questions or concerns that you may have regarding the information given to you following your procedure. If we do not reach you, we will leave a message.      SIGNATURES/CONFIDENTIALITY: You and/or your care partner have signed paperwork which will be entered into your electronic medical record.  These signatures attest to the fact that that the information above on your After Visit Summary has been reviewed and is understood.  Full responsibility of the confidentiality of this discharge information lies with you and/or your care-partner.

## 2023-08-18 NOTE — Progress Notes (Unsigned)
1541 Robinul 0.1 mg IV given due large amount of secretions upon assessment.  MD made aware, vss

## 2023-08-21 ENCOUNTER — Telehealth: Payer: Self-pay

## 2023-08-21 NOTE — Telephone Encounter (Signed)
  Follow up Call-     08/18/2023    2:22 PM  Call back number  Post procedure Call Back phone  # [phone]  Permission to leave phone message Yes     Patient questions:  Do you have a fever, pain , or abdominal swelling? No. Pain Score  0 *  Have you tolerated food without any problems? Yes.    Have you been able to return to your normal activities? Yes.    Do you have any questions about your discharge instructions: Diet   No. Medications  No. Follow up visit  No.  Do you have questions or concerns about your Care? No.  Actions: * If pain score is 4 or above: No action needed, pain <4.

## 2023-08-24 ENCOUNTER — Encounter (HOSPITAL_BASED_OUTPATIENT_CLINIC_OR_DEPARTMENT_OTHER): Payer: Self-pay | Admitting: *Deleted

## 2023-08-24 ENCOUNTER — Emergency Department (HOSPITAL_BASED_OUTPATIENT_CLINIC_OR_DEPARTMENT_OTHER): Payer: Managed Care, Other (non HMO)

## 2023-08-24 ENCOUNTER — Other Ambulatory Visit: Payer: Self-pay

## 2023-08-24 ENCOUNTER — Emergency Department (HOSPITAL_BASED_OUTPATIENT_CLINIC_OR_DEPARTMENT_OTHER)
Admission: EM | Admit: 2023-08-24 | Discharge: 2023-08-24 | Disposition: A | Discharge status: Home / Self Care | Payer: Managed Care, Other (non HMO) | Attending: Emergency Medicine | Admitting: Emergency Medicine

## 2023-08-24 DIAGNOSIS — Z79899 Other long term (current) drug therapy: Secondary | ICD-10-CM | POA: Diagnosis not present

## 2023-08-24 DIAGNOSIS — J101 Influenza due to other identified influenza virus with other respiratory manifestations: Secondary | ICD-10-CM | POA: Insufficient documentation

## 2023-08-24 DIAGNOSIS — Z1152 Encounter for screening for COVID-19: Secondary | ICD-10-CM | POA: Insufficient documentation

## 2023-08-24 DIAGNOSIS — E039 Hypothyroidism, unspecified: Secondary | ICD-10-CM | POA: Diagnosis not present

## 2023-08-24 DIAGNOSIS — R059 Cough, unspecified: Secondary | ICD-10-CM

## 2023-08-24 DIAGNOSIS — I10 Essential (primary) hypertension: Secondary | ICD-10-CM | POA: Diagnosis not present

## 2023-08-24 LAB — RESP PANEL BY RT-PCR (RSV, FLU A&B, COVID)  RVPGX2
Influenza A by PCR: POSITIVE — AB
Influenza B by PCR: NEGATIVE
Resp Syncytial Virus by PCR: NEGATIVE
SARS Coronavirus 2 by RT PCR: NEGATIVE

## 2023-08-24 MED ORDER — PROMETHAZINE-DM 6.25-15 MG/5ML PO SYRP: 2.5000 mL | ORAL_SOLUTION | Freq: Four times a day (QID) | ORAL | 0 refills | Status: AC | PRN

## 2023-08-24 MED ORDER — BENZONATATE 100 MG PO CAPS: 100.0000 mg | ORAL_CAPSULE | Freq: Three times a day (TID) | ORAL | 0 refills | Status: AC | PRN

## 2023-08-24 NOTE — ED Triage Notes (Signed)
Pt had a colonoscopy and endoscopy on Friday and since he has had significant couging and this has been interfering with her ability to sleep.  She reports she has soreness in ribs from coughing.  Pt notes that she has hx of chronic cough but cough since procedure has been more.  No fever or chills.

## 2023-08-24 NOTE — ED Provider Notes (Signed)
[facility] Provider Note   CSN: [id number] Arrival date & time: 08/24/23  1500     History  Chief Complaint  Patient presents with   Cough    Terri [name] is a 53 y.o. female.   Cough   53 year old female presents emergency department complaints of cough.  Patient states that she has been with cough for the past 7+ seizures.  Recently had EGD as well as colonoscopy done this past Friday by GI in the outpatient setting.  States that she felt fine today after but subsequently developed cough sometime around Sunday.  She has a cough that is worse than her baseline cough.  Denies any fever, chills, sore throat, nasal congestion, ear pain, productivity of cough, shortness of breath.  Has tried over-the-counter cough medicine without improvement of symptoms prompting visit to the emergency department.  Significant other also with cough for the past few days as well.   Past medical history significant for amlodipine, GERD, hypothyroidism  Home Medications Prior to Admission medications   Medication Sig Start Date End Date Taking? Authorizing Provider  benzonatate (TESSALON) 100 MG capsule Take 1 capsule (100 mg total) by mouth 3 (three) times daily as needed. 08/24/23  Yes Sherian Maroon A, PA  promethazine-dextromethorphan (PROMETHAZINE-DM) 6.25-15 MG/5ML syrup Take 2.5 mLs by mouth 4 (four) times daily as needed for cough. 08/24/23  Yes Sherian Maroon A, PA  amLODipine (NORVASC) 2.5 MG tablet Take 1 tablet by mouth once daily 07/28/23   Copland, Gwenlyn Found, MD  calcium-vitamin D (OSCAL WITH D) 250-125 MG-UNIT tablet Take 1 tablet by mouth daily.    [provider]  methimazole (TAPAZOLE) 5 MG tablet Take 1 tablet (5 mg total) by mouth as directed. 1 tablet Monday through Thursday, none the rest of the week 07/07/23   Shamleffer, Konrad Dolores, MD  metoprolol tartrate (LOPRESSOR) 50 MG tablet Take 1 tablet by mouth twice daily  07/28/23   Copland, Gwenlyn Found, MD  omeprazole (PRILOSEC) 20 MG capsule Take 1 capsule (20 mg total) by mouth daily. 07/04/23   Copland, Gwenlyn Found, MD      Allergies    Patient has no known allergies.    Review of Systems   Review of Systems  Respiratory:  Positive for cough.   All other systems reviewed and are negative.   Physical Exam Updated Vital Signs BP (!) 166/82   Pulse (!) 106   Temp 98.9 F (37.2 C)   Resp 18   LMP 07/31/2023 (Exact Date)   SpO2 99%  Physical Exam Vitals and nursing note reviewed.  Constitutional:      General: She is not in acute distress.    Appearance: She is well-developed.  HENT:     Head: Normocephalic and atraumatic.     Nose: No congestion or rhinorrhea.     Mouth/Throat:     Mouth: Mucous membranes are moist.     Pharynx: Oropharynx is clear. No posterior oropharyngeal erythema.  Eyes:     Conjunctiva/sclera: Conjunctivae normal.  Cardiovascular:     Rate and Rhythm: Normal rate and regular rhythm.     Heart sounds: No murmur heard. Pulmonary:     Effort: Pulmonary effort is normal. No respiratory distress.     Breath sounds: Normal breath sounds. No wheezing, rhonchi or rales.  Abdominal:     Palpations: Abdomen is soft.     Tenderness: There is no abdominal tenderness.  Musculoskeletal:  General: No swelling.     Cervical back: Neck supple.  Skin:    General: Skin is warm and dry.     Capillary Refill: Capillary refill takes less than 2 seconds.  Neurological:     Mental Status: She is alert.  Psychiatric:        Mood and Affect: Mood normal.     ED Results / Procedures / Treatments   Labs (all labs ordered are listed, but only abnormal results are displayed) Labs Reviewed  RESP PANEL BY RT-PCR (RSV, FLU A&B, COVID)  [id number] - Abnormal; Notable for the following components:      Result Value   Influenza A by PCR POSITIVE (*)    All other components within normal limits    EKG None  Radiology DG Chest 2  View Result Date: 08/24/2023 CLINICAL DATA:  Cough. EXAM: CHEST - 2 VIEW COMPARISON:  06/21/2023 FINDINGS: The heart size and mediastinal contours are within normal limits. Both lungs are clear. The visualized skeletal structures are unremarkable. IMPRESSION: No active cardiopulmonary disease. Electronically Signed   By: Beckie Salts M.D.   On: 08/24/2023 16:12    Procedures Procedures    Medications Ordered in ED Medications - No data to display  ED Course/ Medical Decision Making/ A&P                                 Medical Decision Making Amount and/or Complexity of Data Reviewed Radiology: ordered.   This patient presents to the ED for concern of cough, this involves an extensive number of treatment options, and is a complaint that carries with it a high risk of complications and morbidity.  The differential diagnosis includes COVID, flu, RSV, GERD, asthma, COPD, pneumonia, malignancy, other   Co morbidities that complicate the patient evaluation  See HPI   Additional history obtained:  Additional history obtained from EMR External records from outside source obtained and reviewed including hospital records   Lab Tests:  I Ordered, and personally interpreted labs.  The pertinent results include: Viral testing positive for influenza A   Imaging Studies ordered:  I ordered imaging studies including chest x-ray I independently visualized and interpreted imaging which showed no acute cardiopulmonary abnormalities I agree with the radiologist interpretation   Cardiac Monitoring: / EKG:  The patient was maintained on a cardiac monitor.  I personally viewed and interpreted the cardiac monitored which showed an underlying rhythm of: Sinus rhythm   Consultations Obtained:  N/a   Problem List / ED Course / Critical interventions / Medication management  Cough, influenza A Reevaluation of the patient showed that the patient stayed the same I have reviewed the  patients home medicines and have made adjustments as needed   Social Determinants of Health:  Denies tobacco, illicit drug use   Test / Admission - Considered:  Cough, influenza A Vitals signs significant for hypertension blood pressure 156/82. Otherwise within normal range and stable throughout visit. Laboratory/imaging studies significant for: See above 53 year old female presents emergency department complaints of cough.  Cough present for the past 7+ years with acute worsening over the past week or so after EGD/colonoscopy was performed.  Coughed and began right after procedure but a day or 2 after.  Has been ill with similar symptoms at bedside.  On exam, lungs clear to auscultation.  Viral testing performed by triage staff for which positive for influenza A.  Chest x-ray without obvious  pneumonia or other acute cardiopulmonary malady.  Suspect the patient's acute on chronic cough likely secondary to influenza A.  Patient outside of the window for initiation of Tamiflu.  Will try antitussive agents to use as needed and recommend follow-up with primary care in the outpatient setting.  Treatment plan discussed at length with patient and she acknowledged understanding was agreeable to said plan.  Patient overall well-appearing, afebrile in no acute distress. Worrisome signs and symptoms were discussed with the patient, and the patient acknowledged understanding to return to the ED if noticed. Patient was stable upon discharge.          Final Clinical Impression(s) / ED Diagnoses Final diagnoses:  Influenza A  Cough, unspecified type    Rx / DC Orders ED Discharge Orders     None         Peter Garter, [place] 08/24/23 Mart Piggs, MD 08/25/23 [phone]

## 2023-08-24 NOTE — Discharge Instructions (Signed)
As discussed, you do have the flu.  Will try a couple different cough medicines for treatment of your symptoms.  Please not hesitate to return if the worrisome signs and symptoms we discussed become apparent.

## 2023-10-17 ENCOUNTER — Institutional Professional Consult (permissible substitution): Payer: Managed Care, Other (non HMO) | Admitting: Internal Medicine

## 2024-01-10 ENCOUNTER — Ambulatory Visit: Payer: Managed Care, Other (non HMO) | Admitting: Internal Medicine

## 2024-02-04 ENCOUNTER — Other Ambulatory Visit: Payer: Self-pay | Admitting: Family Medicine

## 2024-02-04 DIAGNOSIS — I10 Essential (primary) hypertension: Secondary | ICD-10-CM

## 2024-03-08 ENCOUNTER — Ambulatory Visit: Admitting: Internal Medicine

## 2024-03-19 ENCOUNTER — Other Ambulatory Visit: Payer: Self-pay | Admitting: Family Medicine

## 2024-03-19 DIAGNOSIS — I10 Essential (primary) hypertension: Secondary | ICD-10-CM

## 2024-03-22 ENCOUNTER — Ambulatory Visit: Admitting: Internal Medicine

## 2024-04-19 ENCOUNTER — Ambulatory Visit: Admitting: Internal Medicine

## 2024-04-19 ENCOUNTER — Encounter: Payer: Self-pay | Admitting: Internal Medicine

## 2024-04-19 VITALS — BP 138/88 | HR 80 | Ht 60.0 in | Wt 177.2 lb

## 2024-04-19 DIAGNOSIS — R053 Chronic cough: Secondary | ICD-10-CM

## 2024-04-19 DIAGNOSIS — E0590 Thyrotoxicosis, unspecified without thyrotoxic crisis or storm: Secondary | ICD-10-CM

## 2024-04-19 DIAGNOSIS — E0500 Thyrotoxicosis with diffuse goiter without thyrotoxic crisis or storm: Secondary | ICD-10-CM

## 2024-04-19 MED ORDER — OMEPRAZOLE 20 MG PO CPDR: 20.0000 mg | DELAYED_RELEASE_CAPSULE | Freq: Every day | ORAL | 0 refills | Status: AC

## 2024-04-19 NOTE — Progress Notes (Signed)
 Name: Terri [name]  MRN/ DOB: [id number], [birth year]    Age/ Sex: 53 y.o., female     PCP: Copland, Harlene BROCKS, MD   Reason for Endocrinology Evaluation: Hyperthyroidism     Initial Endocrinology Clinic Visit: 01/06/2021    PATIENT IDENTIFIER: Terri [name] is a 53 y.o., female with a past medical history of HTN and hyperthyroidism. She has followed with [facility] since 01/06/2021 for consultative assistance with management of her Subclinical Hyperthyroidism.   HISTORICAL SUMMARY:   Pt has been noted to have a suppressed TSH in 10/2019 at < 0.01 uIU/mL  with normal FT4, during routine work up.    She has been started on Methimazole  in 10/2019 due to symptoms of hyperthyroidism.      No FH of thyroid  disease      SUBJECTIVE:     Today (04/19/2024):  Terri [name] is here for  hyperthyroidism.  She is accompanied by her boyfriend Terri [name] Patient has been noted weight loss No local neck swelling  No diarrhea but has occasional constipation No tremors No eye symptoms  She continues with chronic cough , used to be on PPI but only took it for 2 weeks, she has an upcoming follow-up appointment with PCP No heartburn   Methimazole  5 mg, 1 tablet Monday through Thursday     HISTORY:  Past Medical History:  Past Medical History:  Diagnosis Date   Abnormal Pap smear of cervix    cryotherapy   Graves disease    Hypertension    Migraine    Past Surgical History:  Past Surgical History:  Procedure Laterality Date   LAPAROSCOPIC BILATERAL SALPINGECTOMY Bilateral 03/18/2014   Procedure: LAPAROSCOPIC BILATERAL SALPINGECTOMY;  Surgeon: Harland BROCKS Birkenhead, MD;  Location: [facility];  Service: Gynecology;  Laterality: Bilateral;   LAPAROSCOPIC LYSIS OF ADHESIONS Bilateral 03/18/2014   Procedure: LAPAROSCOPIC LYSIS OF ADHESIONS;  Surgeon: Harland BROCKS Birkenhead, MD;  Location: [facility];  Service: Gynecology;  Laterality: Bilateral;   Social History:  reports that she has never  smoked. She has never used smokeless tobacco. She reports that she does not drink alcohol and does not use drugs. Family History:  Family History  Problem Relation Age of Onset   Hypertension Mother    Cancer Maternal Aunt 20       colon Cancer   Cancer Paternal Aunt 58       brest cancer   Heart disease Maternal Grandmother    Stroke Maternal Grandmother    Stroke Paternal Grandmother    Colon cancer Neg Hx    Colon polyps Neg Hx    Rectal cancer Neg Hx    Stomach cancer Neg Hx    Esophageal cancer Neg Hx      HOME MEDICATIONS: Allergies as of 04/19/2024   No Known Allergies      Medication List        Accurate as of April 19, 2024  7:33 AM. If you have any questions, ask your nurse or doctor.          amLODipine  2.5 MG tablet Commonly known as: NORVASC  Take 1 tablet by mouth once daily   benzonatate  100 MG capsule Commonly known as: TESSALON  Take 1 capsule (100 mg total) by mouth 3 (three) times daily as needed.   calcium-vitamin D  250-125 MG-UNIT tablet Commonly known as: OSCAL WITH D Take 1 tablet by mouth daily.   methimazole  5 MG tablet Commonly known as: TAPAZOLE  Take 1 tablet (  5 mg total) by mouth as directed. 1 tablet Monday through Thursday, none the rest of the week   metoprolol  tartrate 50 MG tablet Commonly known as: LOPRESSOR  Take 1 tablet (50 mg total) by mouth 2 (two) times daily. Needs appt   omeprazole  20 MG capsule Commonly known as: PRILOSEC Take 1 capsule (20 mg total) by mouth daily.   promethazine -dextromethorphan 6.25-15 MG/5ML syrup Commonly known as: PROMETHAZINE -DM Take 2.5 mLs by mouth 4 (four) times daily as needed for cough.             OBJECTIVE:   PHYSICAL EXAM: VS: BP 138/88 (BP Location: Left Arm, Patient Position: Sitting, Cuff Size: Large)   Pulse 80   Ht 5' (1.524 m)   Wt 177 lb 3.2 oz (80.4 kg)   SpO2 99%   BMI 34.61 kg/m    EXAM: General: Pt appears well and is in NAD  Eyes: External eye  exam normal without stare, lid lag or exophthalmos.  EOM intact.    Neck: General: Supple without adenopathy. Thyroid : Thyroid  size normal.  No goiter or nodules appreciated. No thyroid  bruit.  Lungs: Clear with good BS bilat   Heart: Auscultation: RRR.  Abdomen: soft, nontender  Extremities:  BL LE: No pretibial edema  Mental Status: Judgment, insight: Intact Orientation: Oriented to time, place, and person Mood and affect: No depression, anxiety, or agitation       DATA REVIEWED:    Latest Reference Range & Units 04/19/24 07:51  TSH mIU/L 0.77  Triiodothyronine,Free,Serum 2.3 - 4.2 pg/mL 2.7  T4,Free(Direct) 0.8 - 1.8 ng/dL 0.9     Latest Reference Range & Units 06/21/23 08:41  Sodium 135 - 145 mEq/L 137  Potassium 3.5 - 5.1 mEq/L 3.4 (L)  Chloride 96 - 112 mEq/L 104  CO2 19 - 32 mEq/L 26  Glucose 70 - 99 mg/dL 97  BUN 6 - 23 mg/dL 14  Creatinine 9.59 - 8.79 mg/dL 9.20  Calcium 8.4 - 89.4 mg/dL 9.1  Alkaline Phosphatase 39 - 117 U/L 70  Albumin 3.5 - 5.2 g/dL 4.3  AST 0 - 37 U/L 18  ALT 0 - 35 U/L 14  Total Protein 6.0 - 8.3 g/dL 6.9  Total Bilirubin 0.2 - 1.2 mg/dL 0.8  GFR >39.99 mL/min 85.88    Latest Reference Range & Units 06/21/23 08:41  WBC 4.0 - 10.5 K/uL 6.2  RBC 3.87 - 5.11 Mil/uL 4.46  Hemoglobin 12.0 - 15.0 g/dL 88.3 (L)  HCT 63.9 - 53.9 % 36.9  MCV 78.0 - 100.0 fl 82.7  MCHC 30.0 - 36.0 g/dL 68.4  RDW 88.4 - 84.4 % 17.4 (H)  Platelets 150.0 - 400.0 K/uL 355.0    Results for OZELLE, BRUBACHER (MRN [id number]) as of 03/23/2021 07:30  Ref. Range 01/07/2020 08:30  [facility] Latest Ref Range: <=2.00 IU/L 8.93 (H)    ASSESSMENT / PLAN / RECOMMENDATIONS:   Hyperthyroidism Secondary to Graves' Disease:   - Patient is clinically euthyroid -No local neck symptoms - No local neck symptoms  -TFTs are within normal range  Medications  methimazole  5 mg 1 tablet Monday through Thursday, none the rest of the week  2. Graves' Disease:  - No extra  thyroidal manifestations of Graves' disease  - Patient encouraged to follow-up with ophthalmology   3.  Chronic cough:  -I have advised her to try PPI for approximately 6 weeks, a refill of Prilosec was sent to the pharmacy - We also discussed antihistamine - Will defer further management  to PCP  F/U in 6 months   Signed electronically by: Stefano Redgie Butts, MD  [facility] Endocrinology  [facility] [address] [place] [place], [place] [address] Phone: [phone] FAX: [phone]      CC: Watt Harlene BROCKS, MD [address] [place] [address] Phone: [phone]  Fax: [phone]   Return to Endocrinology clinic as below: Future Appointments  Date Time Provider Department Center  05/06/2024  8:40 AM Copland, Harlene BROCKS, MD [id number]

## 2024-04-20 LAB — TSH: TSH: 0.77 m[IU]/L

## 2024-04-20 LAB — T4, FREE: Free T4: 0.9 ng/dL (ref 0.8–1.8)

## 2024-04-20 LAB — T3, FREE: T3, Free: 2.7 pg/mL (ref 2.3–4.2)

## 2024-04-22 ENCOUNTER — Ambulatory Visit: Payer: Self-pay | Admitting: Internal Medicine

## 2024-04-22 MED ORDER — METHIMAZOLE 5 MG PO TABS: 5.0000 mg | ORAL_TABLET | ORAL | 2 refills | Status: AC

## 2024-04-30 NOTE — Patient Instructions (Addendum)
 Good to see you again today- I will be in touch with your labs and pap asap Tetanus today Recommend flu shot, shingles series and covid booster Assuming all is well please see me in about 6 months unless your BP is being monitored by your other doctors Let me know if the cough comes back again after 6 weeks of PPI

## 2024-04-30 NOTE — Progress Notes (Addendum)
 [facility] [address] [place], [place] [address] [phone]  Date:  05/06/2024   Name:  Terri [name]   DOB:  August 30, 1970   MRN:  [id number]  PCP:  Watt Harlene BROCKS, MD    Chief Complaint: Follow-up (No concerns /Declines flu shot )   History of Present Illness:  Terri [name] is a 53 y.o. very pleasant female patient who presents with the following:  Pt seen today for a medication recheck Last visit with me about one year ago - history of hypertension, migraine headaches, vitamin D  deficiency, overweight, subclinical hypothyroidism due to Graves' disease   She also follows-up with [facility] for her thyroid  - last visit about 2 weeks ago  Hyperthyroidism Secondary to Graves' Disease:  - Patient is clinically euthyroid -No local neck symptoms - No local neck symptoms  -TFTs are within normal range Medications  methimazole  5 mg 1 tablet Monday through Thursday, none the rest of the week 2. Graves' Disease: - No extra thyroidal manifestations of Graves' disease  - Patient encouraged to follow-up with ophthalmology   -flu; declines  -covid booster -shingrix- recommended  -mammo 12/24- negative  -pap 8/22, negative,  can be updated today  -colon 1/25  Discussed the use of AI scribe software for clinical note transcription with the patient, who gave verbal consent to proceed.  History of Present Illness Terri [name] is a 53 year old female who presents for follow-up regarding her cough and medication management.  She has been experiencing a cough for the past two weeks and was previously seen by another doctor. She was prescribed omeprazole  for stomach acid, which she has been taking as directed for a month. She notes some improvement with this medication.  She is currently taking metoprolol  twice a day and amlodipine  once a day. She requests refills for these medications and prefers a  90-day supply from [facility].  She has not been exercising regularly but mentions that she likes to walk. No breast changes or concerns have been noted.  She has not had her flu shot yet and declines it at this time.  She has a Education officer, community but has not been for a while and plans to make an appointment. She has an eye doctor appointment scheduled for December and notes that her vision has worsened.   Patient Active Problem List   Diagnosis Date Noted   Graves disease 03/23/2021   Subclinical hyperthyroidism 01/07/2020   Cough 07/15/2019   Abnormal abdominal CT scan  see 2015 scan  referral to GYN made 08/03/2014   Vitamin D  deficiency 10/15/2012   Essential hypertension, benign 09/24/2012   History of migraine headaches 09/24/2012   Overweight 09/24/2012    Past Medical History:  Diagnosis Date   Abnormal Pap smear of cervix    cryotherapy   Graves disease    Hypertension    Migraine     Past Surgical History:  Procedure Laterality Date   LAPAROSCOPIC BILATERAL SALPINGECTOMY Bilateral 03/18/2014   Procedure: LAPAROSCOPIC BILATERAL SALPINGECTOMY;  Surgeon: Harland BROCKS Birkenhead, MD;  Location: [facility];  Service: Gynecology;  Laterality: Bilateral;   LAPAROSCOPIC LYSIS OF ADHESIONS Bilateral 03/18/2014   Procedure: LAPAROSCOPIC LYSIS OF ADHESIONS;  Surgeon: Harland BROCKS Birkenhead, MD;  Location: [facility];  Service: Gynecology;  Laterality: Bilateral;    Social History   Tobacco Use   Smoking status: Never   Smokeless tobacco: Never  Substance Use Topics  Alcohol use: No   Drug use: No    Family History  Problem Relation Age of Onset   Hypertension Mother    Cancer Maternal Aunt 80       colon Cancer   Cancer Paternal Aunt 65       brest cancer   Heart disease Maternal Grandmother    Stroke Maternal Grandmother    Stroke Paternal Grandmother    Colon cancer Neg Hx    Colon polyps Neg Hx    Rectal cancer Neg Hx    Stomach cancer Neg Hx    Esophageal cancer Neg Hx     No Known  Allergies  Medication list has been reviewed and updated.  Current Outpatient Medications on File Prior to Visit  Medication Sig Dispense Refill   calcium-vitamin D  (OSCAL WITH D) 250-125 MG-UNIT tablet Take 1 tablet by mouth daily.     methimazole  (TAPAZOLE ) 5 MG tablet Take 1 tablet (5 mg total) by mouth as directed. 1 tablet Monday through Thursday, none the rest of the week 48 tablet 2   omeprazole  (PRILOSEC) 20 MG capsule Take 1 capsule (20 mg total) by mouth daily. 90 capsule 0   benzonatate  (TESSALON ) 100 MG capsule Take 1 capsule (100 mg total) by mouth 3 (three) times daily as needed. (Patient not taking: Reported on 05/06/2024) 30 capsule 0   promethazine -dextromethorphan (PROMETHAZINE -DM) 6.25-15 MG/5ML syrup Take 2.5 mLs by mouth 4 (four) times daily as needed for cough. (Patient not taking: Reported on 05/06/2024) 473 mL 0   No current facility-administered medications on file prior to visit.    Review of Systems:  As per HPI- otherwise negative.   Physical Examination: Vitals:   05/06/24 0832  BP: 134/82  Pulse: 86  SpO2: 99%   Vitals:   05/06/24 0832  Weight: 176 lb (79.8 kg)  Height: 5' (1.524 m)   Body mass index is 34.37 kg/m. Ideal Body Weight: Weight in (lb) to have BMI = 25: 127.7  GEN: no acute distress.  Obese, looks well  HEENT: Atraumatic, Normocephalic.  Bilateral TM wnl, oropharynx normal.  PEERL,EOMI.   Ears and Nose: No external deformity. CV: RRR, No M/G/R. No JVD. No thrill. No extra heart sounds. PULM: CTA B, no wheezes, crackles, rhonchi. No retractions. No resp. distress. No accessory muscle use. ABD: S, NT, ND, +BS. No rebound. No HSM. EXTR: No c/c/e PSYCH: Normally interactive. Conversant.  Pap collected; normal vulva, vagina and cervix   Assessment and Plan: Essential hypertension - Plan: CBC, Comprehensive metabolic panel with GFR, metoprolol  tartrate (LOPRESSOR ) 50 MG tablet  Screening for hyperlipidemia - Plan: Lipid  panel  Screening for diabetes mellitus - Plan: Hemoglobin A1c  Subclinical hyperthyroidism  Hypertension, unspecified type - Plan: amLODipine  (NORVASC ) 2.5 MG tablet  Immunization due - Plan: Td vaccine greater than or equal to 7yo preservative free IM  Screening for cervical cancer - Plan: Cytology - PAP  Prediabetes  Assessment & Plan Adult Wellness Visit Routine wellness visit with no new concerns. Mammogram current until later this year, colonoscopy recently completed. - Perform Pap smear. - Administer tetanus vaccine. - Discuss and recommend shingles vaccine. - Perform blood work: CBC, metabolic panel, blood glucose, lipid profile. - Encourage regular dental and eye exams. - Encourage regular exercise, such as walking.  Chronic cough Chronic cough partially improved with omeprazole  for suspected gastroesophageal reflux disease. - Continue omeprazole  for six weeks. - Consider gastroenterology referral if symptoms persist after six weeks for further evaluation, including possible  esophagoscopy.  Hypertension Hypertension managed with amlodipine  and metoprolol . - Refill amlodipine  2.5 mg oral daily. - Refill metoprolol  tartrate 50 mg oral twice daily.  Signed Harlene Schroeder, MD  Received labs as below, message to patient  Results for orders placed or performed in visit on 05/06/24  CBC   Collection Time: 05/06/24  8:55 AM  Result Value Ref Range   WBC 5.6 4.0 - 10.5 K/uL   RBC 4.35 3.87 - 5.11 Mil/uL   Platelets 379.0 150.0 - 400.0 K/uL   Hemoglobin 10.8 (L) 12.0 - 15.0 g/dL   HCT 65.9 (L) 63.9 - 53.9 %   MCV 78.3 78.0 - 100.0 fl   MCHC 31.7 30.0 - 36.0 g/dL   RDW 81.9 (H) 88.4 - 84.4 %  Comprehensive metabolic panel with GFR   Collection Time: 05/06/24  8:55 AM  Result Value Ref Range   Sodium 139 135 - 145 mEq/L   Potassium 3.8 3.5 - 5.1 mEq/L   Chloride 104 96 - 112 mEq/L   CO2 25 19 - 32 mEq/L   Glucose, Bld 93 70 - 99 mg/dL   BUN 10 6 - 23 mg/dL    Creatinine, Ser 9.16 0.40 - 1.20 mg/dL   Total Bilirubin 0.7 0.2 - 1.2 mg/dL   Alkaline Phosphatase 62 39 - 117 U/L   AST 14 0 - 37 U/L   ALT 8 0 - 35 U/L   Total Protein 6.7 6.0 - 8.3 g/dL   Albumin 4.2 3.5 - 5.2 g/dL   GFR 19.55 >39.99 mL/min   Calcium 9.4 8.4 - 10.5 mg/dL  Hemoglobin J8r   Collection Time: 05/06/24  8:55 AM  Result Value Ref Range   Hgb A1c MFr Bld 6.0 4.6 - 6.5 %  Lipid panel   Collection Time: 05/06/24  8:55 AM  Result Value Ref Range   Cholesterol 153 0 - 200 mg/dL   Triglycerides 21.9 0.0 - 149.0 mg/dL   HDL 53.79 >60.99 mg/dL   VLDL 84.3 0.0 - 59.9 mg/dL   LDL Cholesterol 91 0 - 99 mg/dL   Total CHOL/HDL Ratio 3    NonHDL 106.36

## 2024-05-06 ENCOUNTER — Encounter: Payer: Self-pay | Admitting: Family Medicine

## 2024-05-06 ENCOUNTER — Ambulatory Visit: Payer: Self-pay | Admitting: Family Medicine

## 2024-05-06 ENCOUNTER — Ambulatory Visit (INDEPENDENT_AMBULATORY_CARE_PROVIDER_SITE_OTHER): Payer: Self-pay | Admitting: Family Medicine

## 2024-05-06 ENCOUNTER — Other Ambulatory Visit (HOSPITAL_COMMUNITY)
Admission: RE | Admit: 2024-05-06 | Discharge: 2024-05-06 | Disposition: A | Discharge status: Home / Self Care | Source: Ambulatory Visit | Attending: Family Medicine | Admitting: Family Medicine

## 2024-05-06 VITALS — BP 134/82 | HR 86 | Ht 60.0 in | Wt 176.0 lb

## 2024-05-06 DIAGNOSIS — I10 Essential (primary) hypertension: Secondary | ICD-10-CM | POA: Diagnosis not present

## 2024-05-06 DIAGNOSIS — R7303 Prediabetes: Secondary | ICD-10-CM

## 2024-05-06 DIAGNOSIS — Z13220 Encounter for screening for lipoid disorders: Secondary | ICD-10-CM

## 2024-05-06 DIAGNOSIS — E0590 Thyrotoxicosis, unspecified without thyrotoxic crisis or storm: Secondary | ICD-10-CM | POA: Diagnosis not present

## 2024-05-06 DIAGNOSIS — Z23 Encounter for immunization: Secondary | ICD-10-CM | POA: Diagnosis not present

## 2024-05-06 DIAGNOSIS — D649 Anemia, unspecified: Secondary | ICD-10-CM

## 2024-05-06 DIAGNOSIS — Z131 Encounter for screening for diabetes mellitus: Secondary | ICD-10-CM | POA: Diagnosis not present

## 2024-05-06 DIAGNOSIS — Z124 Encounter for screening for malignant neoplasm of cervix: Secondary | ICD-10-CM | POA: Insufficient documentation

## 2024-05-06 LAB — COMPREHENSIVE METABOLIC PANEL WITH GFR
ALT: 8 U/L (ref 0–35)
AST: 14 U/L (ref 0–37)
Albumin: 4.2 g/dL (ref 3.5–5.2)
Alkaline Phosphatase: 62 U/L (ref 39–117)
BUN: 10 mg/dL (ref 6–23)
CO2: 25 meq/L (ref 19–32)
Calcium: 9.4 mg/dL (ref 8.4–10.5)
Chloride: 104 meq/L (ref 96–112)
Creatinine, Ser: 0.83 mg/dL (ref 0.40–1.20)
GFR: 80.44 mL/min (ref >60.00)
Glucose, Bld: 93 mg/dL (ref 70–99)
Potassium: 3.8 meq/L (ref 3.5–5.1)
Sodium: 139 meq/L (ref 135–145)
Total Bilirubin: 0.7 mg/dL (ref 0.2–1.2)
Total Protein: 6.7 g/dL (ref 6.0–8.3)

## 2024-05-06 LAB — LIPID PANEL
Cholesterol: 153 mg/dL (ref 0–200)
HDL: 46.2 mg/dL (ref >39.00)
LDL Cholesterol: 91 mg/dL (ref 0–99)
NonHDL: 106.36
Total CHOL/HDL Ratio: 3
Triglycerides: 78 mg/dL (ref 0.0–149.0)
VLDL: 15.6 mg/dL (ref 0.0–40.0)

## 2024-05-06 LAB — CBC
HCT: 34 % — ABNORMAL LOW (ref 36.0–46.0)
Hemoglobin: 10.8 g/dL — ABNORMAL LOW (ref 12.0–15.0)
MCHC: 31.7 g/dL (ref 30.0–36.0)
MCV: 78.3 fl (ref 78.0–100.0)
Platelets: 379 K/uL (ref 150.0–400.0)
RBC: 4.35 Mil/uL (ref 3.87–5.11)
RDW: 18 % — ABNORMAL HIGH (ref 11.5–15.5)
WBC: 5.6 K/uL (ref 4.0–10.5)

## 2024-05-06 LAB — HEMOGLOBIN A1C: Hgb A1c MFr Bld: 6 % (ref 4.6–6.5)

## 2024-05-06 MED ORDER — AMLODIPINE BESYLATE 2.5 MG PO TABS: 2.5000 mg | ORAL_TABLET | Freq: Every day | ORAL | 3 refills | Status: AC

## 2024-05-06 MED ORDER — METOPROLOL TARTRATE 50 MG PO TABS: 50.0000 mg | ORAL_TABLET | Freq: Two times a day (BID) | ORAL | 3 refills | Status: AC

## 2024-05-07 ENCOUNTER — Ambulatory Visit (INDEPENDENT_AMBULATORY_CARE_PROVIDER_SITE_OTHER)

## 2024-05-07 ENCOUNTER — Other Ambulatory Visit: Payer: Self-pay | Admitting: Family Medicine

## 2024-05-07 ENCOUNTER — Encounter: Payer: Self-pay | Admitting: Family Medicine

## 2024-05-07 DIAGNOSIS — D649 Anemia, unspecified: Secondary | ICD-10-CM

## 2024-05-07 DIAGNOSIS — D509 Iron deficiency anemia, unspecified: Secondary | ICD-10-CM

## 2024-05-07 LAB — FERRITIN: Ferritin: 4.2 ng/mL — ABNORMAL LOW (ref 10.0–291.0)

## 2024-05-08 ENCOUNTER — Encounter: Payer: Self-pay | Admitting: Family Medicine

## 2024-05-13 ENCOUNTER — Encounter: Payer: Self-pay | Admitting: Family Medicine

## 2024-05-13 LAB — CYTOLOGY - PAP
Comment: NEGATIVE
Diagnosis: NEGATIVE
High risk HPV: NEGATIVE

## 2024-10-17 ENCOUNTER — Ambulatory Visit: Admitting: Internal Medicine
